# Patient Record
Sex: Female | Born: 1980 | Race: White | Hispanic: No | Marital: Married | Smoking: Never smoker
Health system: Southern US, Community
[De-identification: ages and names within clinical notes are randomized; demographics above are authoritative.]

---

## 1999-05-25 ENCOUNTER — Other Ambulatory Visit: Admission: RE | Admit: 1999-05-25 | Discharge: 1999-05-25 | Payer: Self-pay | Admitting: Internal Medicine

## 2016-05-04 NOTE — Progress Notes (Signed)
Consult Note: Gyn-Onc  Consult was requested by Dr. Marvel Faulkner for the evaluation of Tiffany Faulkner 35 y.o. female  CC:  Chief Complaint  Patient presents with  . adenocarcinoma    Assessment/Faulkner:  [name]  is a 35 y.o.  year old with clinical stage IB1 cervical cancer (adenocarcinoma).  I am recommending PET scan to evaluate for gross extra-cervical disease and to complete the staging.  If stage I disease is confirmed I recommend robotic radical hysterectomy and bilateral salpingectomy with lymph node dissection.   I discussed that she appears eligible for fertility sparing surgery with radical trachelectomy. I discussed what this involves and the potential for increased obstetric risks, necessary cesarean delivery and completion hysterectomy at a later date. The patient has determined that she has completed child bearing and desires definitive surgery with radical hysterectomy.  I discussed risks of the procedure including  bleeding, infection, damage to internal organs (such as bladder,ureters, bowels), blood clot, reoperation and rehospitalization. I discussed the nature of radical parametrial dissection and the increased risk for ureteral injury (including fistula and stricture) and bladder injury which might require additional procedures to fix. We also discussed neurologic injury to bladder and bowel function and the potential for persistent urinary retention and the need to catheterize.   Surgery is scheduled for 05/28/15.   HPI: Tiffany Faulkner is a 35 year old G1P1 who is seen in consultation at the request of Dr Tiffany Faulkner for adenocarcinoma of the cervix.  The patient was considering having a second child and went to see Dr Tiffany Faulkner in November, 2017 for IUD removal. 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   The patient was seen for colposcopy with Dr Tiffany Faulkner on 04/20/16 and a friable lesion  was seen on the face of the cervix. It was biopsied at 4 o'cloc, 2 o'clock, 11 and 7 o'clock as well as ECC and endometrial biopsies all of which confirmed adenocarcinoma.  She reports a prior pap smear in 2015 at the time of her prior delivery which was normal. 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  The patient is otherwise very healthy. She has a history of 1 prior preterm vaginal delivery but no abdominal surgeries.  She reports vaginal spotting but it has been present since 2015 when her IUD was placed.  She reports postcoital bleeding for approximately 3 years (no recent change).   Current Meds:  No outpatient encounter prescriptions on file as of 05/05/2016.   No facility-administered encounter medications on file as of 05/05/2016.     Allergy: Not on File  Social Hx:   Social History   Social History  . Marital status: Married    Spouse name: N/A  . Number of children: N/A  . Years of education: N/A   Occupational History  . Not on file.   Social History Main Topics  . Smoking status: Never Smoker  . Smokeless tobacco: Never Used  . Alcohol use No  . Drug use: No  . Sexual activity: Not on file   Other Topics Concern  . Not on file   Social History Narrative  . No narrative on file    Past Surgical Hx: History reviewed. No pertinent surgical history.  Past Medical Hx: History reviewed. No pertinent past medical history.  Past Gynecological History:  SVD x 1 (preterm at 28 weeks) No  LMP recorded.  Family Hx: History reviewed. No pertinent family history.  Review of Systems:  Constitutional  Feels well,    ENT Normal appearing ears and nares bilaterally Skin/Breast  No rash, sores, jaundice, itching, dryness Cardiovascular  No chest pain, shortness of breath, or edema  Pulmonary  No cough or wheeze.  Gastro Intestinal  No nausea, vomitting, or diarrhoea. No bright red blood per rectum, no  abdominal pain, change in bowel movement, or constipation.  Genito Urinary  No frequency, urgency, dysuria, + bleeding and postcoital spotting Musculo Skeletal  No myalgia, arthralgia, joint swelling or pain  Neurologic  No weakness, numbness, change in gait,  Psychology  No depression, anxiety, insomnia.   Vitals:  Blood pressure 126/75, pulse 80, temperature 97.7 F (36.5 C), temperature source Oral, resp. rate (!) 158, height 5' 3.5" (1.613 m), weight 130 lb 9.6 oz (59.2 kg), SpO2 100 %.  Physical Exam: WD in NAD Neck  Supple NROM, without any enlargements.  Lymph Node Survey No cervical supraclavicular or inguinal adenopathy Cardiovascular  Pulse normal rate, regularity and rhythm. S1 and S2 normal.  Lungs  Clear to auscultation bilateraly, without wheezes/crackles/rhonchi. Good air movement.  Skin  No rash/lesions/breakdown  Psychiatry  Alert and oriented to person, place, and time  Abdomen  Normoactive bowel sounds, abdomen soft, non-tender and thin without evidence of hernia.  Back No CVA tenderness Genito Urinary  Vulva/vagina: Normal external female genitalia.   No lesions. No discharge or bleeding.  Bladder/urethra:  No lesions or masses, well supported bladder  Vagina: normal  Cervix: 3.5cm circumferential, central friable white lesion of endocervix/ectropion  Uterus:  Small, mobile, no parametrial involvement or nodularity.  Adnexa: no discrete masses. Rectal  Good tone, no masses no cul de sac nodularity. No parametrial involvement. Extremities  No bilateral cyanosis, clubbing or edema.   Donaciano Eva, MD  05/05/2016, 11:01 AM

## 2016-05-05 ENCOUNTER — Ambulatory Visit: Payer: BLUE CROSS/BLUE SHIELD | Attending: Gynecologic Oncology | Admitting: Gynecologic Oncology

## 2016-05-05 ENCOUNTER — Encounter: Payer: Self-pay | Admitting: Gynecologic Oncology

## 2016-05-05 ENCOUNTER — Telehealth: Payer: Self-pay

## 2016-05-05 VITALS — BP 126/75 | HR 80 | Temp 97.7°F | Resp 158 | Ht 63.5 in | Wt 130.6 lb

## 2016-05-05 DIAGNOSIS — C801 Malignant (primary) neoplasm, unspecified: Secondary | ICD-10-CM

## 2016-05-05 DIAGNOSIS — C539 Malignant neoplasm of cervix uteri, unspecified: Secondary | ICD-10-CM | POA: Insufficient documentation

## 2016-05-05 DIAGNOSIS — N930 Postcoital and contact bleeding: Secondary | ICD-10-CM | POA: Insufficient documentation

## 2016-05-05 NOTE — Telephone Encounter (Signed)
Notified pt of upcoming appointments: PET Scan 05/24/16 at [facility], and Surgery: 05/27/16 at 1pm, pt will call to schedule preoperative testing appointment, due to needing child care.

## 2016-05-05 NOTE — Patient Instructions (Signed)
RN will call you with PET Scan appointment.     Preparing for your Surgery   Plan for surgery on May 27, 2016 with Dr. Everitt Amber.  Pre-operative Testing -You will receive a phone call from presurgical testing at [facility] to arrange for a pre-operative testing appointment before your surgery.  This appointment normally occurs one to two weeks before your scheduled surgery.   -Bring your insurance card, copy of an advanced directive if applicable, medication list  -At that visit, you will be asked to sign a consent for a possible blood transfusion in case a transfusion becomes necessary during surgery.  The need for a blood transfusion is rare but having consent is a necessary part of your care.     -You should not be taking blood thinners or aspirin at least ten days prior to surgery unless instructed by your surgeon.  Day Before Surgery at [facility] will be asked to take in a light diet the day before surgery.  Avoid carbonated beverages.  You will be advised to have nothing to eat or drink after midnight the evening before.     Eat a light diet the day before surgery.  Examples including soups, broths,  toast, yogurt, mashed potatoes.  Things to avoid include carbonated beverages  (fizzy beverages), raw fruits and raw vegetables, or beans.    If your bowels are filled with gas, your surgeon will have difficulty  visualizing your pelvic organs which increases your surgical risks.  Your role in recovery Your role is to become active as soon as directed by your doctor, while still giving yourself time to heal.  Rest when you feel tired. You will be asked to do the following in order to speed your recovery:  - Cough and breathe deeply. This helps toclear and expand your lungs and can prevent pneumonia. You may be given a spirometer to practice deep breathing. A staff member will show you how to use the spirometer. - Do mild physical activity. Walking or moving  your legs help your circulation and body functions return to normal. A staff member will help you when you try to walk and will provide you with simple exercises. Do not try to get up or walk alone the first time. - Actively manage your pain. Managing your pain lets you move in comfort. We will ask you to rate your pain on a scale of zero to 10. It is your responsibility to tell your doctor or nurse where and how much you hurt so your pain can be treated.  Special Considerations -If you are diabetic, you may be placed on insulin after surgery to have closer control over your blood sugars to promote healing and recovery.  This does not mean that you will be discharged on insulin.  If applicable, your oral antidiabetics will be resumed when you are tolerating a solid diet.  -Your final pathology results from surgery should be available by the Friday after surgery and the results will be relayed to you when available.  Eat a light diet the day before surgery.  Examples including soups, broths, toast, yogurt, mashed potatoes.  Things to avoid include carbonated beverages (fizzy beverages), raw fruits and raw vegetables, or beans.   If your bowels are filled with gas, your surgeon will have difficulty visualizing your pelvic organs which increases your surgical risks. Blood Transfusion Information WHAT IS A BLOOD TRANSFUSION? A transfusion is the replacement of blood or some of its parts. Blood  is made up of multiple cells which provide different functions.  Red blood cells carry oxygen and are used for blood loss replacement.  White blood cells fight against infection.  Platelets control bleeding.  Plasma helps clot blood.  Other blood products are available for specialized needs, such as hemophilia or other clotting disorders. BEFORE THE TRANSFUSION  Who gives blood for transfusions?   You may be able to donate blood to be used at a later date on yourself (autologous donation).  Relatives  can be asked to donate blood. This is generally not any safer than if you have received blood from a stranger. The same precautions are taken to ensure safety when a relative's blood is donated.  Healthy volunteers who are fully evaluated to make sure their blood is safe. This is blood bank blood. Transfusion therapy is the safest it has ever been in the practice of medicine. Before blood is taken from a donor, a complete history is taken to make sure that person has no history of diseases nor engages in risky social behavior (examples are intravenous drug use or sexual activity with multiple partners). The donor's travel history is screened to minimize risk of transmitting infections, such as malaria. The donated blood is tested for signs of infectious diseases, such as HIV and hepatitis. The blood is then tested to be sure it is compatible with you in order to minimize the chance of a transfusion reaction. If you or a relative donates blood, this is often done in anticipation of surgery and is not appropriate for emergency situations. It takes many days to process the donated blood. RISKS AND COMPLICATIONS Although transfusion therapy is very safe and saves many lives, the main dangers of transfusion include:   Getting an infectious disease.  Developing a transfusion reaction. This is an allergic reaction to something in the blood you were given. Every precaution is taken to prevent this. The decision to have a blood transfusion has been considered carefully by your caregiver before blood is given. Blood is not given unless the benefits outweigh the risks.

## 2016-05-06 ENCOUNTER — Telehealth: Payer: Self-pay | Admitting: Gynecologic Oncology

## 2016-05-06 NOTE — Telephone Encounter (Signed)
Peer to peer performed for PET scan.  Approval obtained with Auth # [id number] valid through today-06/04/16.  Notified Gaspar Bidding with prior auths via EPIC

## 2016-05-21 NOTE — Patient Instructions (Signed)
Tiffany Faulkner  05/21/2016   Your procedure is scheduled on: 05/27/16    Report to [facility] Main  Entrance take [place]  elevators to 3rd floor to  [place] at [place] AM.  Call this number if you have problems the morning of surgery [phone]   Remember: ONLY 1 PERSON MAY GO WITH YOU TO SHORT STAY TO GET  READY MORNING OF [place].  Do not eat food or drink liquids :After Midnight.             Eat a light diet the day before surgery.  Examples include toast, soups, broths, yogurt and mashed potatoes.  Things to avoid include carbonated beverages, raw fruits and vegetables and beans.       Take these medicines the morning of surgery with A SIP OF WATER: none                                 You may not have any metal on your body including hair pins and              piercings  Do not wear jewelry, make-up, lotions, powders or perfumes, deodorant             Do not wear nail polish.  Do not shave  48 hours prior to surgery.             Do not bring valuables to the hospital. [place].  Contacts, dentures or bridgework may not be worn into surgery.  Leave suitcase in the car. After surgery it may be brought to your room.         Special Instructions: coughing and deep breathing exercises, leg exercises               Please read over the following fact sheets you were given: _____________________________________________________________________             [facility] - Preparing for Surgery Before surgery, you can play an important role.  Because skin is not sterile, your skin needs to be as free of germs as possible.  You can reduce the number of germs on your skin by washing with CHG (chlorahexidine gluconate) soap before surgery.  CHG is an antiseptic cleaner which kills germs and bonds with the skin to continue killing germs even after washing. Please DO NOT use if you have an allergy to CHG or  antibacterial soaps.  If your skin becomes reddened/irritated stop using the CHG and inform your nurse when you arrive at Short Stay. Do not shave (including legs and underarms) for at least 48 hours prior to the first CHG shower.  You may shave your face/neck. Please follow these instructions carefully:  1.  Shower with CHG Soap the night before surgery and the  morning of Surgery.  2.  If you choose to wash your hair, wash your hair first as usual with your  normal  shampoo.  3.  After you shampoo, rinse your hair and body thoroughly to remove the  shampoo.                           4.  Use CHG as you would  any other liquid soap.  You can apply chg directly  to the skin and wash                       Gently with a scrungie or clean washcloth.  5.  Apply the CHG Soap to your body ONLY FROM THE NECK DOWN.   Do not use on face/ open                           Wound or open sores. Avoid contact with eyes, ears mouth and genitals (private parts).                       Wash face,  Genitals (private parts) with your normal soap.             6.  Wash thoroughly, paying special attention to the area where your surgery  will be performed.  7.  Thoroughly rinse your body with warm water from the neck down.  8.  DO NOT shower/wash with your normal soap after using and rinsing off  the CHG Soap.                9.  Pat yourself dry with a clean towel.            10.  Wear clean pajamas.            11.  Place clean sheets on your bed the night of your first shower and do not  sleep with pets. Day of Surgery : Do not apply any lotions/deodorants the morning of surgery.  Please wear clean clothes to the hospital/surgery center.  FAILURE TO FOLLOW THESE INSTRUCTIONS MAY RESULT IN THE CANCELLATION OF YOUR SURGERY PATIENT SIGNATURE_________________________________  NURSE SIGNATURE__________________________________  ________________________________________________________________________  WHAT IS A BLOOD  TRANSFUSION? Blood Transfusion Information  A transfusion is the replacement of blood or some of its parts. Blood is made up of multiple cells which provide different functions.  Red blood cells carry oxygen and are used for blood loss replacement.  White blood cells fight against infection.  Platelets control bleeding.  Plasma helps clot blood.  Other blood products are available for specialized needs, such as hemophilia or other clotting disorders. BEFORE THE TRANSFUSION  Who gives blood for transfusions?   Healthy volunteers who are fully evaluated to make sure their blood is safe. This is blood bank blood. Transfusion therapy is the safest it has ever been in the practice of medicine. Before blood is taken from a donor, a complete history is taken to make sure that person has no history of diseases nor engages in risky social behavior (examples are intravenous drug use or sexual activity with multiple partners). The donor's travel history is screened to minimize risk of transmitting infections, such as malaria. The donated blood is tested for signs of infectious diseases, such as HIV and hepatitis. The blood is then tested to be sure it is compatible with you in order to minimize the chance of a transfusion reaction. If you or a relative donates blood, this is often done in anticipation of surgery and is not appropriate for emergency situations. It takes many days to process the donated blood. RISKS AND COMPLICATIONS Although transfusion therapy is very safe and saves many lives, the main dangers of transfusion include:   Getting an infectious disease.  Developing a transfusion reaction. This is an allergic reaction to something in  the blood you were given. Every precaution is taken to prevent this. The decision to have a blood transfusion has been considered carefully by your caregiver before blood is given. Blood is not given unless the benefits outweigh the risks. AFTER THE  TRANSFUSION  Right after receiving a blood transfusion, you will usually feel much better and more energetic. This is especially true if your red blood cells have gotten low (anemic). The transfusion raises the level of the red blood cells which carry oxygen, and this usually causes an energy increase.  The nurse administering the transfusion will monitor you carefully for complications. HOME CARE INSTRUCTIONS  No special instructions are needed after a transfusion. You may find your energy is better. Speak with your caregiver about any limitations on activity for underlying diseases you may have. SEEK MEDICAL CARE IF:   Your condition is not improving after your transfusion.  You develop redness or irritation at the intravenous (IV) site. SEEK IMMEDIATE MEDICAL CARE IF:  Any of the following symptoms occur over the next 12 hours:  Shaking chills.  You have a temperature by mouth above 102 F (38.9 C), not controlled by medicine.  Chest, back, or muscle pain.  People around you feel you are not acting correctly or are confused.  Shortness of breath or difficulty breathing.  Dizziness and fainting.  You get a rash or develop hives.  You have a decrease in urine output.  Your urine turns a dark color or changes to pink, red, or brown. Any of the following symptoms occur over the next 10 days:  You have a temperature by mouth above 102 F (38.9 C), not controlled by medicine.  Shortness of breath.  Weakness after normal activity.  The white part of the eye turns yellow (jaundice).  You have a decrease in the amount of urine or are urinating less often.  Your urine turns a dark color or changes to pink, red, or brown. Document Released: 04/23/2000 Document Revised: 07/19/2011 Document Reviewed: 12/11/2007 ExitCare Patient Information 2014 [place].  _______________________________________________________________________  Incentive Spirometer  An incentive  spirometer is a tool that can help keep your lungs clear and active. This tool measures how well you are filling your lungs with each breath. Taking long deep breaths may help reverse or decrease the chance of developing breathing (pulmonary) problems (especially infection) following:  A long period of time when you are unable to move or be active. BEFORE THE PROCEDURE   If the spirometer includes an indicator to show your best effort, your nurse or respiratory therapist will set it to a desired goal.  If possible, sit up straight or lean slightly forward. Try not to slouch.  Hold the incentive spirometer in an upright position. INSTRUCTIONS FOR USE  1. Sit on the edge of your bed if possible, or sit up as far as you can in bed or on a chair. 2. Hold the incentive spirometer in an upright position. 3. Breathe out normally. 4. Place the mouthpiece in your mouth and seal your lips tightly around it. 5. Breathe in slowly and as deeply as possible, raising the piston or the ball toward the top of the column. 6. Hold your breath for 3-5 seconds or for as long as possible. Allow the piston or ball to fall to the bottom of the column. 7. Remove the mouthpiece from your mouth and breathe out normally. 8. Rest for a few seconds and repeat Steps 1 through 7 at least 10 times every  1-2 hours when you are awake. Take your time and take a few normal breaths between deep breaths. 9. The spirometer may include an indicator to show your best effort. Use the indicator as a goal to work toward during each repetition. 10. After each set of 10 deep breaths, practice coughing to be sure your lungs are clear. If you have an incision (the cut made at the time of surgery), support your incision when coughing by placing a pillow or rolled up towels firmly against it. Once you are able to get out of bed, walk around indoors and cough well. You may stop using the incentive spirometer when instructed by your caregiver.   RISKS AND COMPLICATIONS  Take your time so you do not get dizzy or light-headed.  If you are in pain, you may need to take or ask for pain medication before doing incentive spirometry. It is harder to take a deep breath if you are having pain. AFTER USE  Rest and breathe slowly and easily.  It can be helpful to keep track of a log of your progress. Your caregiver can provide you with a simple table to help with this. If you are using the spirometer at home, follow these instructions: [place] IF:   You are having difficultly using the spirometer.  You have trouble using the spirometer as often as instructed.  Your pain medication is not giving enough relief while using the spirometer.  You develop fever of 100.5 F (38.1 C) or higher. SEEK IMMEDIATE MEDICAL CARE IF:   You cough up bloody sputum that had not been present before.  You develop fever of 102 F (38.9 C) or greater.  You develop worsening pain at or near the incision site. MAKE SURE YOU:   Understand these instructions.  Will watch your condition.  Will get help right away if you are not doing well or get worse. Document Released: 09/06/2006 Document Revised: 07/19/2011 Document Reviewed: 11/07/2006 [facility] Patient Information 2014 [place], [place].   ________________________________________________________________________

## 2016-05-24 ENCOUNTER — Encounter (HOSPITAL_COMMUNITY): Payer: Self-pay | Admitting: Radiology

## 2016-05-24 ENCOUNTER — Telehealth: Payer: Self-pay

## 2016-05-24 ENCOUNTER — Encounter (HOSPITAL_COMMUNITY)
Admission: RE | Admit: 2016-05-24 | Discharge: 2016-05-24 | Disposition: A | Discharge status: Home / Self Care | Payer: BLUE CROSS/BLUE SHIELD | Source: Ambulatory Visit | Attending: Gynecologic Oncology | Admitting: Gynecologic Oncology

## 2016-05-24 ENCOUNTER — Encounter (INDEPENDENT_AMBULATORY_CARE_PROVIDER_SITE_OTHER): Payer: Self-pay

## 2016-05-24 DIAGNOSIS — Z01818 Encounter for other preprocedural examination: Secondary | ICD-10-CM | POA: Insufficient documentation

## 2016-05-24 DIAGNOSIS — C760 Malignant neoplasm of head, face and neck: Secondary | ICD-10-CM

## 2016-05-24 DIAGNOSIS — C539 Malignant neoplasm of cervix uteri, unspecified: Secondary | ICD-10-CM

## 2016-05-24 DIAGNOSIS — N930 Postcoital and contact bleeding: Secondary | ICD-10-CM | POA: Diagnosis not present

## 2016-05-24 DIAGNOSIS — N838 Other noninflammatory disorders of ovary, fallopian tube and broad ligament: Secondary | ICD-10-CM | POA: Diagnosis not present

## 2016-05-24 LAB — PREGNANCY, URINE: Preg Test, Ur: NEGATIVE

## 2016-05-24 LAB — URINALYSIS, ROUTINE W REFLEX MICROSCOPIC
BILIRUBIN URINE: NEGATIVE
GLUCOSE, UA: NEGATIVE mg/dL
Hgb urine dipstick: NEGATIVE
Ketones, ur: NEGATIVE mg/dL
Leukocytes, UA: NEGATIVE
NITRITE: NEGATIVE
Protein, ur: NEGATIVE mg/dL
SPECIFIC GRAVITY, URINE: 1.01 (ref 1.005–1.030)
pH: 8 (ref 5.0–8.0)

## 2016-05-24 LAB — CBC WITH DIFFERENTIAL/PLATELET
Basophils Absolute: 0 10*3/uL (ref 0.0–0.1)
Basophils Relative: 0 %
Eosinophils Absolute: 0.1 10*3/uL (ref 0.0–0.7)
Eosinophils Relative: 1 %
HCT: 38.2 % (ref 36.0–46.0)
HEMOGLOBIN: 13.2 g/dL (ref 12.0–15.0)
LYMPHS ABS: 1.8 10*3/uL (ref 0.7–4.0)
LYMPHS PCT: 36 %
MCH: 31.7 pg (ref 26.0–34.0)
MCHC: 34.6 g/dL (ref 30.0–36.0)
MCV: 91.8 fL (ref 78.0–100.0)
MONOS PCT: 6 %
Monocytes Absolute: 0.3 10*3/uL (ref 0.1–1.0)
NEUTROS PCT: 57 %
Neutro Abs: 2.8 10*3/uL (ref 1.7–7.7)
Platelets: 212 10*3/uL (ref 150–400)
RBC: 4.16 MIL/uL (ref 3.87–5.11)
RDW: 12.7 % (ref 11.5–15.5)
WBC: 4.9 10*3/uL (ref 4.0–10.5)

## 2016-05-24 LAB — COMPREHENSIVE METABOLIC PANEL
ALK PHOS: 37 U/L — AB (ref 38–126)
ALT: 7 U/L — AB (ref 14–54)
ANION GAP: 6 (ref 5–15)
AST: 12 U/L — ABNORMAL LOW (ref 15–41)
Albumin: 4.4 g/dL (ref 3.5–5.0)
BUN: 12 mg/dL (ref 6–20)
CALCIUM: 8.8 mg/dL — AB (ref 8.9–10.3)
CO2: 25 mmol/L (ref 22–32)
CREATININE: 0.57 mg/dL (ref 0.44–1.00)
Chloride: 107 mmol/L (ref 101–111)
Glucose, Bld: 87 mg/dL (ref 65–99)
Potassium: 4.1 mmol/L (ref 3.5–5.1)
Sodium: 138 mmol/L (ref 135–145)
TOTAL PROTEIN: 6.9 g/dL (ref 6.5–8.1)
Total Bilirubin: 0.3 mg/dL (ref 0.3–1.2)

## 2016-05-24 LAB — GLUCOSE, CAPILLARY: Glucose-Capillary: 84 mg/dL (ref 65–99)

## 2016-05-24 LAB — ABO/RH: ABO/RH(D): A POS

## 2016-05-24 MED ORDER — FLUDEOXYGLUCOSE F - 18 (FDG) INJECTION
7.6000 | Freq: Once | INTRAVENOUS | Status: AC | PRN
  Administered 2016-05-24: 7.6 via INTRAVENOUS

## 2016-05-24 NOTE — Telephone Encounter (Signed)
Pt informed of normal test result. Verbalized understanding. 

## 2016-05-27 ENCOUNTER — Encounter (HOSPITAL_COMMUNITY): Payer: Self-pay | Admitting: *Deleted

## 2016-05-27 ENCOUNTER — Ambulatory Visit (HOSPITAL_COMMUNITY)
Admission: RE | Admit: 2016-05-27 | Discharge: 2016-05-28 | Disposition: A | Discharge status: Home / Self Care | Payer: BLUE CROSS/BLUE SHIELD | Source: Ambulatory Visit | Attending: Gynecologic Oncology | Admitting: Gynecologic Oncology

## 2016-05-27 ENCOUNTER — Encounter (HOSPITAL_COMMUNITY)
Admission: RE | Disposition: A | Discharge status: Home / Self Care | Payer: Self-pay | Source: Ambulatory Visit | Attending: Gynecologic Oncology

## 2016-05-27 ENCOUNTER — Ambulatory Visit (HOSPITAL_COMMUNITY): Payer: BLUE CROSS/BLUE SHIELD | Admitting: Registered Nurse

## 2016-05-27 DIAGNOSIS — N838 Other noninflammatory disorders of ovary, fallopian tube and broad ligament: Secondary | ICD-10-CM | POA: Insufficient documentation

## 2016-05-27 DIAGNOSIS — C539 Malignant neoplasm of cervix uteri, unspecified: Secondary | ICD-10-CM | POA: Diagnosis not present

## 2016-05-27 DIAGNOSIS — N930 Postcoital and contact bleeding: Secondary | ICD-10-CM | POA: Insufficient documentation

## 2016-05-27 HISTORY — PX: BILATERAL SALPINGECTOMY: SHX5743

## 2016-05-27 HISTORY — PX: LYMPH NODE DISSECTION: SHX5087

## 2016-05-27 HISTORY — PX: ROBOTIC ASSISTED TOTAL HYSTERECTOMY: SHX6085

## 2016-05-27 LAB — TYPE AND SCREEN
ABO/RH(D): A POS
ANTIBODY SCREEN: NEGATIVE

## 2016-05-27 SURGERY — HYSTERECTOMY, TOTAL, ROBOT-ASSISTED
Anesthesia: General

## 2016-05-27 MED ORDER — MIDAZOLAM HCL 5 MG/5ML IJ SOLN
INTRAMUSCULAR | Status: DC | PRN
  Administered 2016-05-27 (×2): 1 mg via INTRAVENOUS

## 2016-05-27 MED ORDER — ROCURONIUM BROMIDE 50 MG/5ML IV SOSY
PREFILLED_SYRINGE | INTRAVENOUS | Status: AC
  Filled 2016-05-27: qty 5

## 2016-05-27 MED ORDER — OXYCODONE-ACETAMINOPHEN 5-325 MG PO TABS
1.0000 | ORAL_TABLET | ORAL | Status: DC | PRN
  Administered 2016-05-27: 1 via ORAL
  Administered 2016-05-28: 2 via ORAL
  Filled 2016-05-27 (×2): qty 2

## 2016-05-27 MED ORDER — ONDANSETRON HCL 4 MG/2ML IJ SOLN
INTRAMUSCULAR | Status: AC
  Filled 2016-05-27: qty 2

## 2016-05-27 MED ORDER — KETAMINE HCL 10 MG/ML IJ SOLN
INTRAMUSCULAR | Status: AC
  Filled 2016-05-27: qty 1

## 2016-05-27 MED ORDER — ACETAMINOPHEN 500 MG PO TABS
1000.0000 mg | ORAL_TABLET | Freq: Once | ORAL | Status: AC
  Administered 2016-05-27: 1000 mg via ORAL

## 2016-05-27 MED ORDER — DEXAMETHASONE SODIUM PHOSPHATE 10 MG/ML IJ SOLN
INTRAMUSCULAR | Status: AC
  Filled 2016-05-27: qty 1

## 2016-05-27 MED ORDER — FENTANYL CITRATE (PF) 100 MCG/2ML IJ SOLN
INTRAMUSCULAR | Status: DC | PRN
  Administered 2016-05-27 (×3): 50 ug via INTRAVENOUS

## 2016-05-27 MED ORDER — GABAPENTIN 300 MG PO CAPS
300.0000 mg | ORAL_CAPSULE | Freq: Once | ORAL | Status: AC
  Administered 2016-05-27: 300 mg via ORAL

## 2016-05-27 MED ORDER — PROMETHAZINE HCL 25 MG/ML IJ SOLN: 6.2500 mg | INTRAMUSCULAR | Status: DC | PRN

## 2016-05-27 MED ORDER — KETAMINE HCL 10 MG/ML IJ SOLN
INTRAMUSCULAR | Status: DC | PRN
  Administered 2016-05-27 (×2): 15 mg via INTRAVENOUS

## 2016-05-27 MED ORDER — LIDOCAINE HCL (CARDIAC) 20 MG/ML IV SOLN
INTRAVENOUS | Status: DC | PRN
  Administered 2016-05-27: 100 mg via INTRAVENOUS

## 2016-05-27 MED ORDER — MIDAZOLAM HCL 2 MG/2ML IJ SOLN
INTRAMUSCULAR | Status: AC
  Filled 2016-05-27: qty 2

## 2016-05-27 MED ORDER — DEXAMETHASONE SODIUM PHOSPHATE 10 MG/ML IJ SOLN
INTRAMUSCULAR | Status: DC | PRN
  Administered 2016-05-27: 10 mg via INTRAVENOUS

## 2016-05-27 MED ORDER — PROPOFOL 10 MG/ML IV BOLUS
INTRAVENOUS | Status: AC
  Filled 2016-05-27: qty 20

## 2016-05-27 MED ORDER — ACETAMINOPHEN 500 MG PO TABS
ORAL_TABLET | ORAL | Status: AC
  Filled 2016-05-27: qty 2

## 2016-05-27 MED ORDER — FENTANYL CITRATE (PF) 100 MCG/2ML IJ SOLN
INTRAMUSCULAR | Status: AC
  Filled 2016-05-27: qty 2

## 2016-05-27 MED ORDER — LIDOCAINE 2% (20 MG/ML) 5 ML SYRINGE
INTRAMUSCULAR | Status: AC
  Filled 2016-05-27: qty 5

## 2016-05-27 MED ORDER — HYDROMORPHONE HCL 2 MG/ML IJ SOLN: 0.2000 mg | INTRAMUSCULAR | Status: DC | PRN

## 2016-05-27 MED ORDER — GABAPENTIN 300 MG PO CAPS
ORAL_CAPSULE | ORAL | Status: AC
  Filled 2016-05-27: qty 1

## 2016-05-27 MED ORDER — SUGAMMADEX SODIUM 200 MG/2ML IV SOLN
INTRAVENOUS | Status: DC | PRN
  Administered 2016-05-27: 120 mg via INTRAVENOUS

## 2016-05-27 MED ORDER — ENOXAPARIN SODIUM 40 MG/0.4ML ~~LOC~~ SOLN
40.0000 mg | SUBCUTANEOUS | Status: AC
  Administered 2016-05-27: 40 mg via SUBCUTANEOUS
  Filled 2016-05-27: qty 0.4

## 2016-05-27 MED ORDER — GABAPENTIN 300 MG PO CAPS
600.0000 mg | ORAL_CAPSULE | Freq: Every day | ORAL | Status: AC
  Administered 2016-05-27: 600 mg via ORAL
  Filled 2016-05-27: qty 2

## 2016-05-27 MED ORDER — KETOROLAC TROMETHAMINE 15 MG/ML IJ SOLN
15.0000 mg | Freq: Four times a day (QID) | INTRAMUSCULAR | Status: DC
  Administered 2016-05-27 – 2016-05-28 (×3): 15 mg via INTRAVENOUS
  Filled 2016-05-27 (×3): qty 1

## 2016-05-27 MED ORDER — ONDANSETRON HCL 4 MG/2ML IJ SOLN: 4.0000 mg | Freq: Four times a day (QID) | INTRAMUSCULAR | Status: DC | PRN

## 2016-05-27 MED ORDER — ENOXAPARIN SODIUM 40 MG/0.4ML ~~LOC~~ SOLN
40.0000 mg | SUBCUTANEOUS | Status: DC
  Administered 2016-05-28: 40 mg via SUBCUTANEOUS
  Filled 2016-05-27: qty 0.4

## 2016-05-27 MED ORDER — LACTATED RINGERS IV SOLN
INTRAVENOUS | Status: DC | PRN
  Administered 2016-05-27: 09:00:00 via INTRAVENOUS

## 2016-05-27 MED ORDER — EPHEDRINE 5 MG/ML INJ
INTRAVENOUS | Status: AC
  Filled 2016-05-27: qty 10

## 2016-05-27 MED ORDER — KCL IN DEXTROSE-NACL 20-5-0.45 MEQ/L-%-% IV SOLN
INTRAVENOUS | Status: DC
  Administered 2016-05-27: 18:00:00 via INTRAVENOUS
  Filled 2016-05-27 (×3): qty 1000

## 2016-05-27 MED ORDER — ROCURONIUM BROMIDE 10 MG/ML (PF) SYRINGE
PREFILLED_SYRINGE | INTRAVENOUS | Status: DC | PRN
  Administered 2016-05-27: 10 mg via INTRAVENOUS
  Administered 2016-05-27: 20 mg via INTRAVENOUS
  Administered 2016-05-27: 5 mg via INTRAVENOUS
  Administered 2016-05-27: 10 mg via INTRAVENOUS
  Administered 2016-05-27: 50 mg via INTRAVENOUS

## 2016-05-27 MED ORDER — FENTANYL CITRATE (PF) 250 MCG/5ML IJ SOLN
INTRAMUSCULAR | Status: AC
  Filled 2016-05-27: qty 5

## 2016-05-27 MED ORDER — LACTATED RINGERS IV SOLN
INTRAVENOUS | Status: DC
  Administered 2016-05-27 (×2): 1000 mL via INTRAVENOUS

## 2016-05-27 MED ORDER — GLYCOPYRROLATE 0.2 MG/ML IV SOSY
PREFILLED_SYRINGE | INTRAVENOUS | Status: AC
  Filled 2016-05-27: qty 3

## 2016-05-27 MED ORDER — EPHEDRINE SULFATE-NACL 50-0.9 MG/10ML-% IV SOSY
PREFILLED_SYRINGE | INTRAVENOUS | Status: DC | PRN
  Administered 2016-05-27: 5 mg via INTRAVENOUS

## 2016-05-27 MED ORDER — LACTATED RINGERS IR SOLN
Status: DC | PRN
  Administered 2016-05-27: 1000 mL

## 2016-05-27 MED ORDER — SUGAMMADEX SODIUM 200 MG/2ML IV SOLN
INTRAVENOUS | Status: AC
  Filled 2016-05-27: qty 2

## 2016-05-27 MED ORDER — PROPOFOL 10 MG/ML IV BOLUS
INTRAVENOUS | Status: DC | PRN
  Administered 2016-05-27: 120 mg via INTRAVENOUS

## 2016-05-27 MED ORDER — CEFAZOLIN SODIUM-DEXTROSE 2-4 GM/100ML-% IV SOLN
2.0000 g | INTRAVENOUS | Status: AC
  Administered 2016-05-27: 2 g via INTRAVENOUS
  Filled 2016-05-27: qty 100

## 2016-05-27 MED ORDER — FENTANYL CITRATE (PF) 100 MCG/2ML IJ SOLN
25.0000 ug | INTRAMUSCULAR | Status: DC | PRN
  Administered 2016-05-27 (×3): 50 ug via INTRAVENOUS

## 2016-05-27 MED ORDER — ONDANSETRON HCL 4 MG/2ML IJ SOLN
INTRAMUSCULAR | Status: DC | PRN
  Administered 2016-05-27: 4 mg via INTRAVENOUS

## 2016-05-27 MED ORDER — LACTATED RINGERS IV SOLN: INTRAVENOUS | Status: DC

## 2016-05-27 MED ORDER — CEFAZOLIN SODIUM-DEXTROSE 2-4 GM/100ML-% IV SOLN
INTRAVENOUS | Status: AC
  Filled 2016-05-27: qty 100

## 2016-05-27 MED ORDER — IBUPROFEN 800 MG PO TABS
800.0000 mg | ORAL_TABLET | Freq: Three times a day (TID) | ORAL | Status: DC | PRN
Start: 2016-05-28 — End: 2016-05-28

## 2016-05-27 MED ORDER — ONDANSETRON HCL 4 MG PO TABS: 4.0000 mg | ORAL_TABLET | Freq: Four times a day (QID) | ORAL | Status: DC | PRN

## 2016-05-27 MED ORDER — STERILE WATER FOR IRRIGATION IR SOLN
Status: DC | PRN
  Administered 2016-05-27: 1000 mL

## 2016-05-27 SURGICAL SUPPLY — 63 items
ADH SKN CLS APL DERMABOND .7 (GAUZE/BANDAGES/DRESSINGS) ×2
AGENT HMST KT MTR STRL THRMB (HEMOSTASIS)
APL ESCP 34 STRL LF DISP (HEMOSTASIS)
APPLICATOR SURGIFLO ENDO (HEMOSTASIS) IMPLANT
APPLIER CLIP ROT 10 11.4 M/L (STAPLE) ×4
APR CLP MED LRG 11.4X10 (STAPLE) ×2
BAG LAPAROSCOPIC 12 15 PORT 16 (BASKET) IMPLANT
BAG RETRIEVAL 12/15 (BASKET)
BAG RETRIEVAL 12/15MM (BASKET)
BAG SPEC RTRVL LRG 6X4 10 (ENDOMECHANICALS) ×2
CHLORAPREP W/TINT 26ML (MISCELLANEOUS) ×4 IMPLANT
CLIP APPLIE ROT 10 11.4 M/L (STAPLE) IMPLANT
COVER SURGICAL LIGHT HANDLE (MISCELLANEOUS) ×4 IMPLANT
COVER TIP SHEARS 8 DVNC (MISCELLANEOUS) ×2 IMPLANT
COVER TIP SHEARS 8MM DA VINCI (MISCELLANEOUS) ×2
DERMABOND ADVANCED (GAUZE/BANDAGES/DRESSINGS) ×2
DERMABOND ADVANCED .7 DNX12 (GAUZE/BANDAGES/DRESSINGS) ×2 IMPLANT
DRAPE ARM DVNC X/XI (DISPOSABLE) ×8 IMPLANT
DRAPE COLUMN DVNC XI (DISPOSABLE) ×2 IMPLANT
DRAPE DA VINCI XI ARM (DISPOSABLE) ×8
DRAPE DA VINCI XI COLUMN (DISPOSABLE) ×2
DRAPE SHEET LG 3/4 BI-LAMINATE (DRAPES) ×8 IMPLANT
DRAPE SURG IRRIG POUCH 19X23 (DRAPES) ×4 IMPLANT
ELECT REM PT RETURN 9FT ADLT (ELECTROSURGICAL) ×4
ELECTRODE REM PT RTRN 9FT ADLT (ELECTROSURGICAL) ×2 IMPLANT
GLOVE BIO SURGEON STRL SZ 6 (GLOVE) ×16 IMPLANT
GLOVE BIO SURGEON STRL SZ 6.5 (GLOVE) ×6 IMPLANT
GLOVE BIO SURGEONS STRL SZ 6.5 (GLOVE) ×2
GOWN STRL REUS W/ TWL LRG LVL3 (GOWN DISPOSABLE) ×4 IMPLANT
GOWN STRL REUS W/TWL LRG LVL3 (GOWN DISPOSABLE) ×8
HOLDER FOLEY CATH W/STRAP (MISCELLANEOUS) ×4 IMPLANT
IRRIG SUCT STRYKERFLOW 2 WTIP (MISCELLANEOUS) ×4
IRRIGATION SUCT STRKRFLW 2 WTP (MISCELLANEOUS) ×2 IMPLANT
KIT BASIN OR (CUSTOM PROCEDURE TRAY) ×4 IMPLANT
MANIPULATOR UTERINE 4.5 ZUMI (MISCELLANEOUS) ×2 IMPLANT
MARKER SKIN DUAL TIP RULER LAB (MISCELLANEOUS) ×4 IMPLANT
OBTURATOR OPTICAL STANDARD 8MM (TROCAR) ×2
OBTURATOR OPTICAL STND 8 DVNC (TROCAR) ×2
OBTURATOR OPTICALSTD 8 DVNC (TROCAR) ×2 IMPLANT
OCCLUDER COLPOPNEUMO (BALLOONS) ×4 IMPLANT
PAD POSITIONING PINK XL (MISCELLANEOUS) ×4 IMPLANT
PORT ACCESS TROCAR AIRSEAL 12 (TROCAR) ×2 IMPLANT
PORT ACCESS TROCAR AIRSEAL 5M (TROCAR) ×2
POUCH SPECIMEN RETRIEVAL 10MM (ENDOMECHANICALS) ×2 IMPLANT
SEAL CANN UNIV 5-8 DVNC XI (MISCELLANEOUS) ×8 IMPLANT
SEAL XI 5MM-8MM UNIVERSAL (MISCELLANEOUS) ×8
SET TRI-LUMEN FLTR TB AIRSEAL (TUBING) ×4 IMPLANT
SHEET LAVH (DRAPES) ×4 IMPLANT
SOLUTION ELECTROLUBE (MISCELLANEOUS) ×4 IMPLANT
SURGIFLO W/THROMBIN 8M KIT (HEMOSTASIS) IMPLANT
SUT MNCRL AB 4-0 PS2 18 (SUTURE) ×8 IMPLANT
SUT PROLENE 5 0 CC 1 (SUTURE) IMPLANT
SUT VIC AB 0 CT1 27 (SUTURE) ×12
SUT VIC AB 0 CT1 27XBRD ANTBC (SUTURE) ×2 IMPLANT
SYR 50ML LL SCALE MARK (SYRINGE) ×4 IMPLANT
TOWEL OR 17X26 10 PK STRL BLUE (TOWEL DISPOSABLE) ×4 IMPLANT
TOWEL OR NON WOVEN STRL DISP B (DISPOSABLE) ×4 IMPLANT
TRAP SPECIMEN MUCOUS 40CC (MISCELLANEOUS) IMPLANT
TRAY FOLEY W/METER SILVER 16FR (SET/KITS/TRAYS/PACK) ×4 IMPLANT
TRAY LAPAROSCOPIC (CUSTOM PROCEDURE TRAY) ×4 IMPLANT
TROCAR BLADELESS OPT 5 100 (ENDOMECHANICALS) ×4 IMPLANT
UNDERPAD 30X30 INCONTINENT (UNDERPADS AND DIAPERS) ×4 IMPLANT
WATER STERILE IRR 1500ML POUR (IV SOLUTION) ×2 IMPLANT

## 2016-05-27 NOTE — Anesthesia Preprocedure Evaluation (Addendum)
Anesthesia Evaluation  Patient identified by MRN, date of birth, ID band Patient awake    Reviewed: Allergy & Precautions, NPO status , Patient's Chart, lab work & pertinent test results  Airway Mallampati: II  TM Distance: >3 FB Neck ROM: Full    Dental  (+) Teeth Intact, Dental Advisory Given   Pulmonary neg pulmonary ROS,    Pulmonary exam normal breath sounds clear to auscultation       Cardiovascular Exercise Tolerance: Good negative cardio ROS Normal cardiovascular exam Rhythm:Regular Rate:Normal     Neuro/Psych negative neurological ROS  negative psych ROS   GI/Hepatic negative GI ROS, Neg liver ROS,   Endo/Other  negative endocrine ROS  Renal/GU negative Renal ROS     Musculoskeletal negative musculoskeletal ROS (+)   Abdominal   Peds  Hematology negative hematology ROS (+)   Anesthesia Other Findings Day of surgery medications reviewed with the patient.  Reproductive/Obstetrics clinical stage IB1 cervical cancer                              Anesthesia Physical Anesthesia Plan  ASA: II  Anesthesia Plan: General   Post-op Pain Management:    Induction: Intravenous  Airway Management Planned: Oral ETT  Additional Equipment:   Intra-op Plan:   Post-operative Plan: Extubation in OR  Informed Consent: I have reviewed the patients History and Physical, chart, labs and discussed the procedure including the risks, benefits and alternatives for the proposed anesthesia with the patient or authorized representative who has indicated his/her understanding and acceptance.   Dental advisory given  Plan Discussed with: CRNA  Anesthesia Plan Comments: (Risks/benefits of general anesthesia discussed with patient including risk of damage to teeth, lips, gum, and tongue, nausea/vomiting, allergic reactions to medications, and the possibility of heart attack, stroke and death.  All  patient questions answered.  Patient wishes to proceed.)        Anesthesia Quick Evaluation

## 2016-05-27 NOTE — Anesthesia Procedure Notes (Signed)
Procedure Name: Intubation Date/Time: 05/27/2016 9:06 AM Performed by: Carleene Cooper A Pre-anesthesia Checklist: Patient identified, Timeout performed, Emergency Drugs available, Suction available and Patient being monitored Patient Re-evaluated:Patient Re-evaluated prior to inductionOxygen Delivery Method: Circle system utilized Preoxygenation: Pre-oxygenation with 100% oxygen Intubation Type: IV induction Ventilation: Mask ventilation without difficulty Laryngoscope Size: Mac and 3 Grade View: Grade I Tube type: Oral Tube size: 7.5 mm Number of attempts: 1 Airway Equipment and Method: Stylet Placement Confirmation: ETT inserted through vocal cords under direct vision,  positive ETCO2 and breath sounds checked- equal and bilateral Secured at: 21 cm Tube secured with: Tape Dental Injury: Teeth and Oropharynx as per pre-operative assessment

## 2016-05-27 NOTE — Anesthesia Postprocedure Evaluation (Signed)
Anesthesia Post Note  Patient: Tiffany Faulkner  Procedure(s) Performed: Procedure(s) (LRB): XI ROBOTIC ASSISTED RADICAL  HYSTERECTOMY,BILATERAL OVARIAN TRANSPOSITION (N/A) BILATERAL SALPINGECTOMY (Bilateral) LYMPH NODE DISSECTION (Bilateral)  Patient location during evaluation: PACU Anesthesia Type: General Level of consciousness: awake and alert Pain management: pain level controlled Vital Signs Assessment: post-procedure vital signs reviewed and stable Respiratory status: spontaneous breathing, nonlabored ventilation, respiratory function stable and patient connected to nasal cannula oxygen Cardiovascular status: blood pressure returned to baseline and stable Postop Assessment: no signs of nausea or vomiting Anesthetic complications: no       Last Vitals:  Vitals:   05/27/16 0704 05/27/16 1209  BP: 109/66 (!) 93/57  Pulse: 82 89  Resp: 16 14  Temp: 36.7 C 36.5 C    Last Pain:  Vitals:   05/27/16 1330  TempSrc:   PainSc: [place]

## 2016-05-27 NOTE — Transfer of Care (Signed)
Immediate Anesthesia Transfer of Care Note  Patient: Tanisia Deahl  Procedure(s) Performed: Procedure(s): XI ROBOTIC ASSISTED RADICAL  HYSTERECTOMY,BILATERAL OVARIAN TRANSPOSITION (N/A) BILATERAL SALPINGECTOMY (Bilateral) LYMPH NODE DISSECTION (Bilateral)  Patient Location: PACU  Anesthesia Type:General  Level of Consciousness: awake, alert , oriented and patient cooperative  Airway & Oxygen Therapy: Patient Spontanous Breathing and Patient connected to face mask oxygen  Post-op Assessment: Report given to RN and Post -op Vital signs reviewed and stable  Post vital signs: Reviewed and stable  Last Vitals:  Vitals:   05/27/16 0704  BP: 109/66  Pulse: 82  Resp: 16  Temp: 36.7 C    Last Pain:  Vitals:   05/27/16 0704  TempSrc: Oral      Patients Stated Pain Goal: 3 ([id number])  Complications: No apparent anesthesia complications

## 2016-05-27 NOTE — H&P (View-Only) (Signed)
Consult Note: Gyn-Onc  Consult was requested by Dr. Marvel Plan for the evaluation of Tiffany Faulkner 36 y.o. female  CC:  Chief Complaint  Patient presents with  . adenocarcinoma    Assessment/Plan:  [name]  is a 36 y.o.  year old with clinical stage IB1 cervical cancer (adenocarcinoma).  I am recommending PET scan to evaluate for gross extra-cervical disease and to complete the staging.  If stage I disease is confirmed I recommend robotic radical hysterectomy and bilateral salpingectomy with lymph node dissection.   I discussed that she appears eligible for fertility sparing surgery with radical trachelectomy. I discussed what this involves and the potential for increased obstetric risks, necessary cesarean delivery and completion hysterectomy at a later date. The patient has determined that she has completed child bearing and desires definitive surgery with radical hysterectomy.  I discussed risks of the procedure including  bleeding, infection, damage to internal organs (such as bladder,ureters, bowels), blood clot, reoperation and rehospitalization. I discussed the nature of radical parametrial dissection and the increased risk for ureteral injury (including fistula and stricture) and bladder injury which might require additional procedures to fix. We also discussed neurologic injury to bladder and bowel function and the potential for persistent urinary retention and the need to catheterize.   Surgery is scheduled for 05/28/15.   HPI: Tiffany Faulkner is a 36 year old G1P1 who is seen in consultation at the request of Dr Paula Compton for adenocarcinoma of the cervix.  The patient was considering having a second child and went to see Dr Marvel Plan in November, 2017 for IUD removal. 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   The patient was seen for colposcopy with Dr Marvel Plan on 04/20/16 and a friable lesion  was seen on the face of the cervix. It was biopsied at 4 o'cloc, 2 o'clock, 11 and 7 o'clock as well as ECC and endometrial biopsies all of which confirmed adenocarcinoma.  She reports a prior pap smear in 2015 at the time of her prior delivery which was normal. 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  The patient is otherwise very healthy. She has a history of 1 prior preterm vaginal delivery but no abdominal surgeries.  She reports vaginal spotting but it has been present since 2015 when her IUD was placed.  She reports postcoital bleeding for approximately 3 years (no recent change).   Current Meds:  No outpatient encounter prescriptions on file as of 05/05/2016.   No facility-administered encounter medications on file as of 05/05/2016.     Allergy: Not on File  Social Hx:   Social History   Social History  . Marital status: Married    Spouse name: N/A  . Number of children: N/A  . Years of education: N/A   Occupational History  . Not on file.   Social History Main Topics  . Smoking status: Never Smoker  . Smokeless tobacco: Never Used  . Alcohol use No  . Drug use: No  . Sexual activity: Not on file   Other Topics Concern  . Not on file   Social History Narrative  . No narrative on file    Past Surgical Hx: History reviewed. No pertinent surgical history.  Past Medical Hx: History reviewed. No pertinent past medical history.  Past Gynecological History:  SVD x 1 (preterm at 28 weeks) No  LMP recorded.  Family Hx: History reviewed. No pertinent family history.  Review of Systems:  Constitutional  Feels well,    ENT Normal appearing ears and nares bilaterally Skin/Breast  No rash, sores, jaundice, itching, dryness Cardiovascular  No chest pain, shortness of breath, or edema  Pulmonary  No cough or wheeze.  Gastro Intestinal  No nausea, vomitting, or diarrhoea. No bright red blood per rectum, no  abdominal pain, change in bowel movement, or constipation.  Genito Urinary  No frequency, urgency, dysuria, + bleeding and postcoital spotting Musculo Skeletal  No myalgia, arthralgia, joint swelling or pain  Neurologic  No weakness, numbness, change in gait,  Psychology  No depression, anxiety, insomnia.   Vitals:  Blood pressure 126/75, pulse 80, temperature 97.7 F (36.5 C), temperature source Oral, resp. rate (!) 158, height 5' 3.5" (1.613 m), weight 130 lb 9.6 oz (59.2 kg), SpO2 100 %.  Physical Exam: WD in NAD Neck  Supple NROM, without any enlargements.  Lymph Node Survey No cervical supraclavicular or inguinal adenopathy Cardiovascular  Pulse normal rate, regularity and rhythm. S1 and S2 normal.  Lungs  Clear to auscultation bilateraly, without wheezes/crackles/rhonchi. Good air movement.  Skin  No rash/lesions/breakdown  Psychiatry  Alert and oriented to person, place, and time  Abdomen  Normoactive bowel sounds, abdomen soft, non-tender and thin without evidence of hernia.  Back No CVA tenderness Genito Urinary  Vulva/vagina: Normal external female genitalia.   No lesions. No discharge or bleeding.  Bladder/urethra:  No lesions or masses, well supported bladder  Vagina: normal  Cervix: 3.5cm circumferential, central friable white lesion of endocervix/ectropion  Uterus:  Small, mobile, no parametrial involvement or nodularity.  Adnexa: no discrete masses. Rectal  Good tone, no masses no cul de sac nodularity. No parametrial involvement. Extremities  No bilateral cyanosis, clubbing or edema.   Donaciano Eva, MD  05/05/2016, 11:01 AM

## 2016-05-27 NOTE — Interval H&P Note (Signed)
History and Physical Interval Note:  05/27/2016 8:52 AM  [name]  has presented today for surgery, with the diagnosis of ADENOD CARCINOMA  The various methods of treatment have been discussed with the patient and family. After consideration of risks, benefits and other options for treatment, the patient has consented to  Procedure(s): XI ROBOTIC ASSISTED RADICAL  HYSTERECTOMY (N/A) BILATERAL SALPINGECTOMY (Bilateral) LYMPH NODE DISSECTION (Bilateral) as a surgical intervention .  The patient's history has been reviewed, patient examined, no change in status, stable for surgery.  I have reviewed the patient's chart and labs.  PET showed no gross metastatic disease. Questions were answered to the patient's satisfaction.     Tiffany Faulkner

## 2016-05-27 NOTE — Op Note (Signed)
OPERATIVE NOTE 05/27/17  Surgeon: Donaciano Eva   Assistants: Dr  Lahoma Crocker (an MD assistant was necessary for tissue manipulation, management of robotic instrumentation, retraction and positioning due to the complexity of the case and hospital policies).   Anesthesia: General endotracheal anesthesia  ASA Class: 2   Pre-operative Diagnosis: stage IB1 cervical adenocarcinoma  Post-operative Diagnosis: same  Operation: Robotic-assisted type III radical laparoscopic hysterectomy with bilateral salpingoophorectomy and bilateral pelvic lymphadenectomy  Surgeon: Donaciano Eva  Assistant Surgeon: Lahoma Crocker MD  Anesthesia: GET  Urine Output: 300cc  Operative Findings:  : 3cm cervical tumor (central and anteriorally). No suspicious nodes, normal ovaries bilaterally.  Estimated Blood Loss:  less than 50 mL      Total IV Fluids: 900 ml         Specimens: uterus, cervix with upper vagina, bilateral tubes; left and right pelvic lymph nodes         Complications:  None; patient tolerated the procedure well.         Disposition: PACU - hemodynamically stable.  Procedure Details  The patient was seen in the Holding Room. The risks, benefits, complications, treatment options, and expected outcomes were discussed with the patient.  The patient concurred with the proposed plan, giving informed consent.  The site of surgery properly noted/marked. The patient was identified as Hospital doctor and the procedure verified as a Robotic-assisted radical hysterectomy with bilateral salpingo oophorectomy and bilateral pelvic lymphadenectomy. A Time Out was held and the above information confirmed.  After induction of anesthesia, the patient was draped and prepped in the usual sterile manner. Pt was placed in supine position after anesthesia and draped and prepped in the usual sterile manner. The abdominal drape was placed after the CholoraPrep had been allowed to dry for 3  minutes.  Her arms were tucked to her side with all appropriate precautions.  The chest was secured to the table.  The patient was placed in the semi-lithotomy position in [place].  The perineum was prepped with Betadine.  Foley catheter was placed.  An EEA sizer (large size) was placed vaginally to define the vaginal fornices.  A second time-out was performed.  OG tube placement was confirmed and to suction.    Procedure:  The patient was brought to the operating room where general anesthesia was administered with no complications.  The patient was placed in the dorsal lithotomy position in padded Allen stirrups.  The arms were tucked at the sides with gel pads protecting the elbows and foam protecting the hands. The patient was then prepped.  A Foley was placed to gravity. An EEA sizer was placed in the vaginal fornices.  The patient was then draped in the normal manner.  Next, a 5 mm skin incision was made 1 cm below the subcostal margin in the midclavicular line.  The 5 mm Optiview port and scope was used for direct entry.  Opening pressure was under 10 mm CO2.  The abdomen was insufflated and the findings were noted as above.   At this point and all points during the procedure, the patient's intra-abdominal pressure did not exceed 15 mmHg. Next, a 10 mm skin incision was made at the umbilicus and a right and left port was placed about 10 cm lateral to the robot port on the right and left side.  A fourth arm was placed in the left lower quadrant 2 cm above and superior and medial to the anterior superior iliac spine.  All  ports were placed under direct visualization.  The patient was placed in steep Trendelenburg.  Bowel was away into the upper abdomen.  The robot was docked in the normal manner.  The right retroperitoneum in the pelvis was opened parallel to the IP ligament. The right paravesical space was developed with monopolar and sharp dissection. It was held open with tension on the median  umbilical ligament with the forth arm. The pararectal space was opened with blunt and sharp dissection to mobilize the ureter off of the medial surface of the internal iliac artery. The medial leaf of the broad ligament containing the ureter was held medially (opening the pararectal space) by the assistant's grasper. The right pelvic lymphadenectomy was performed by skeletonizing the internal iliac artery at the bifurcation with the external iliac artery. The obturator nerve was identified in the base of lateral paravesical space. The ureter was mobilized medially off of the dissection by developing the pararectal space. The genitofemoral nerve was identified, skeletonized and mobilized laterally off of the external iliac artery. An enbloc resection of lymph nodes was performed within the following boundaries: the mid portion of the common iliac proximally, the circumflex iliac vein distally, the obturator nerve posteriorally, the genitofemoral nerve laterally. The nodal basin (including obturator space) were confirmed to be empty of nodes and hemostatic. The nodes were placed in an endocatch bag and retrieved at the end of the procedure vaginally.  The same procedure with the same steps was performed on the patient's left side. The left retroperitoneum in the pelvis was opened parallel to the IP ligament. The left paravesical space was developed with monopolar and sharp dissection. It was held open with tension on the median umbilical ligament with the forth arm. The pararectal space was opened with blunt and sharp dissection to mobilize the ureter off of the medial surface of the internal iliac artery. The medial leaf of the broad ligament containing the ureter was held medially (opening the pararectal space) by the assistant's grasper. The left pelvic lymphadenectomy was performed by skeletonizing the internal iliac artery at the bifurcation with the external iliac artery. The obturator nerve was identified in  the base of lateral paravesical space. The ureter was mobilized medially off of the dissection by developing the pararectal space. The genitofemoral nerve was identified, skeletonized and mobilized laterally off of the external iliac artery. An enbloc resection of lymph nodes was performed within the following boundaries: the mid portion of the common iliac proximally, the circumflex iliac vein distally, the obturator nerve posteriorally, the genitofemoral nerve laterally. The nodal basin (including obturator space) were confirmed to be empty of nodes and hemostatic. The nodes were placed in an endocatch bag and retrieved at the end of the procedure vaginally.  The radical hysterectomy was begun by first skeletonizing the right uterine artery at its origin from the internal iliac artery by skeletonizing it 360 degrees and defining the parauterine web between the paravesical and pararectal spaces. The right ureter was skeletonized off of its attachments to the broad ligament and mobilized laterally. Using meticulous blunt and sparing monopolar dissection in short controlled bursts, the right ureter was untunnelled from under the right uterine artery. The anterior vessicouterine ligament was developed and the bladder flap was taken down to below the level of the tumor and below the rounded portion of the EEA sizer. This then definied the anterior bladder pillar. The uterine artery was bipolar fulgarated to seal it, then transected. The uterine vein was also sealed and resected. The  lateral boundary of the parametrium was taken down with biploar and monopolar dissection to the level of the deep vaginal vein. The uterine vessels were retracted superior and medially over the ureter on the right. The ureter was untunnelled through to its entry into the bladder with meticulous sharp dissection and short bursts of monopolar energy. The ureter was dissected off of its attachments to the anterior vagina. The anterior  bladder pillar was skeletonized and sealed with bipolar energy while the ureter was deflected distally. The posterior bladder pillar was also dissected with monopolar scissors from the upper vagina.  The rectovaginal septum was entered posteriorally with sharp dissection and the rectum was dissected off of the vagina. A window was created in the broad ligament with care to mobilize the ureter laterally. This skeletonized the IP ligament which was sealed with bipolar energy and transected.The right uterosacral ligament was transected with bipolar and monopolar energy 1/2 to 2/3rds of the way towards the uterosacral ligament insertion into the sacrum. In doing so meticulous attention was made to identify and wherever possible, spare the hypogastric nerves. The right paravaginal tissues were tubularized around the vagina on the right at the inferior boundary of the dissection.  The left radical hysterectomy was then performed by first skeletonizing the left uterine artery at its origin from the internal iliac artery by skeletonizing it 360 degrees and defining the parauterine web between the paravesical and pararectal spaces. The left ureter was skeletonized off of its attachments to the broad ligament and mobilized laterally. Using meticulous blunt and sparing monopolar dissection in short controlled bursts, the left ureter was untunnelled from under the left uterine artery. The left anterior vessicouterine ligament was developed and the bladder flap was taken down to below the level of the tumor and below the rounded portion of the EEA sizer. This then definied the anterior bladder pillar. The uterine artery was bipolar fulgarated to seal it, then transected. The uterine vein was also sealed and resected. The lateral boundary of the parametrium was taken down with biploar and monopolar dissection to the level of the deep vaginal vein. The uterine vessels were retracted superior and medially over the ureter on the  right. The ureter was untunnelled through to its entry into the bladder with meticulous sharp dissection and short bursts of monopolar energy. The ureter was dissected off of its attachments to the anterior vagina. The anterior bladder pillar was skeletonized and sealed with bipolar energy while the ureter was deflected distally. The posterior bladder pillar was also dissected with monopolar scissors from the upper vagina.  The rectovaginal septum was entered posteriorally with sharp dissection and the rectum was dissected off of the vagina. A window was created in the broad ligament with care to mobilize the ureter laterally. This skeletonized the IP ligament which was sealed with bipolar energy and transected.The left uterosacral ligament was transected with bipolar and monopolar energy 1/2 to 2/3rds of the way towards the uterosacral ligament insertion into the sacrum. In doing so meticulous attention was made to identify and wherever possible, spare the hypogastric nerves. The left paravaginal tissues were tubularized around the vagina on the left at the inferior boundary of the dissection.  The colpotomy was made and the uterus, cervix, bilateral ovaries and tubes were amputated and delivered through the vagina.  Pedicles were inspected and excellent hemostasis was achieved.    The colpotomy at the vaginal cuff was closed with two 0-Vicryl on a CT1 needle in a running manner.  Irrigation was  used and excellent hemostasis was achieved.  At this point in the procedure was completed.  Robotic instruments were removed under direct visulaization.  The robot was undocked. The 10 mm ports were closed with Vicryl on a UR-5 needle and the fascia was closed with 0 Vicryl on a UR-5 needle.  The skin was closed with 4-0 Vicryl in a subcuticular manner.  Dermabond was applied.  Sponge, lap and needle counts correct x 2.  The patient was taken to the recovery room in stable condition.  The vagina was swabbed with   minimal bleeding noted.   All instrument and needle counts were correct x  3.   The patient was transferred to the recovery room in a stable condition.  Donaciano Eva, MD

## 2016-05-28 ENCOUNTER — Encounter (HOSPITAL_COMMUNITY): Payer: Self-pay | Admitting: Gynecologic Oncology

## 2016-05-28 DIAGNOSIS — C539 Malignant neoplasm of cervix uteri, unspecified: Secondary | ICD-10-CM | POA: Diagnosis not present

## 2016-05-28 LAB — CBC
HCT: 31.9 % — ABNORMAL LOW (ref 36.0–46.0)
HEMOGLOBIN: 11.2 g/dL — AB (ref 12.0–15.0)
MCH: 32.6 pg (ref 26.0–34.0)
MCHC: 35.1 g/dL (ref 30.0–36.0)
MCV: 92.7 fL (ref 78.0–100.0)
Platelets: 182 10*3/uL (ref 150–400)
RBC: 3.44 MIL/uL — AB (ref 3.87–5.11)
RDW: 12.7 % (ref 11.5–15.5)
WBC: 8.5 10*3/uL (ref 4.0–10.5)

## 2016-05-28 LAB — BASIC METABOLIC PANEL
ANION GAP: 5 (ref 5–15)
BUN: 7 mg/dL (ref 6–20)
CHLORIDE: 111 mmol/L (ref 101–111)
CO2: 22 mmol/L (ref 22–32)
CREATININE: 0.51 mg/dL (ref 0.44–1.00)
Calcium: 8.2 mg/dL — ABNORMAL LOW (ref 8.9–10.3)
GFR calc non Af Amer: >60 mL/min (ref >60)
Glucose, Bld: 108 mg/dL — ABNORMAL HIGH (ref 65–99)
POTASSIUM: 3.8 mmol/L (ref 3.5–5.1)
SODIUM: 138 mmol/L (ref 135–145)

## 2016-05-28 MED ORDER — IBUPROFEN 800 MG PO TABS: 800.0000 mg | ORAL_TABLET | Freq: Three times a day (TID) | ORAL | 0 refills | Status: DC | PRN

## 2016-05-28 MED ORDER — SENNA 8.6 MG PO TABS: 1.0000 | ORAL_TABLET | Freq: Every day | ORAL | 0 refills | Status: DC

## 2016-05-28 MED ORDER — OXYCODONE-ACETAMINOPHEN 5-325 MG PO TABS: 1.0000 | ORAL_TABLET | ORAL | 0 refills | Status: DC | PRN

## 2016-05-28 NOTE — Progress Notes (Signed)
D/c'D home with family via w/c.Foley left inh and connected with a leg bag.D/c instructions read and signed

## 2016-05-28 NOTE — Discharge Instructions (Signed)
05/27/2016  Return to work: 4 weeks  Activity: 1. Be up and out of the bed during the day.  Take a nap if needed.  You may walk up steps but be careful and use the hand rail.  Stair climbing will tire you more than you think, you may need to stop part way and rest.   2. No lifting or straining for 6 weeks.  3. No driving for 1 weeks.  Do Not drive if you are taking narcotic pain medicine.  4. Shower daily.  Use soap and water on your incision and pat dry; don't rub.   5. No sexual activity and nothing in the vagina for 8 weeks.  Diet: 1. Low sodium Heart Healthy Diet is recommended.  2. It is safe to use a laxative if you have difficulty moving your bowels.   Medications:   Medications:  - Take ibuprofen and tylenol first line for pain control. Take these regularly (every 6 hours) to decrease the build up of pain.  - If necessary, for severe pain not relieved by ibuprofen, take percocet.  - While taking percocet you should take sennakot every night to reduce the likelihood of constipation. If this causes diarrhea, stop its use.   Wound Care: 1. Keep clean and dry.  Shower daily.  Reasons to call the Doctor:   Fever - Oral temperature greater than 100.4 degrees Fahrenheit  Foul-smelling vaginal discharge  Difficulty urinating  Nausea and vomiting  Increased pain at the site of the incision that is unrelieved with pain medicine.  Difficulty breathing with or without chest pain  New calf pain especially if only on one side  Sudden, continuing increased vaginal bleeding with or without clots.   Follow-up: 1. See Everitt Amber in 3 weeks.  2. See Joylene John in 1 week for foley catheter removal.  Contacts: For questions or concerns you should contact:  Dr. Everitt Amber at [phone]  or at [place]

## 2016-05-28 NOTE — Discharge Summary (Signed)
Physician Discharge Summary  Patient ID: Tiffany Faulkner MRN: [id number] DOB/AGE: 1980/06/16 36 y.o.  Admit date: 05/27/2016 Discharge date: 05/28/2016  Admission Diagnoses: Cervical cancer, FIGO stage IB1 [facility])  Discharge Diagnoses:  Principal Problem:   Cervical cancer, FIGO stage IB1 ([place]) Active Problems:   Cervical cancer [facility])   Discharged Condition: good  Hospital Course: patient was admitted 1/118/18 for robotic radical hysterectomy and bilateral pelvic lymphadenectomy. Surgery was uncomplicated. Postoperatively she did well and was meeting discharge criteria on POD 1.   Foley in situ, and patient will be discharged with foley due to radical parametrial resection and risk for urinary retention. Will plan for foley removal in office 1 week postop.  Consults: None  Significant Diagnostic Studies: labs:  CBC    Component Value Date/Time   WBC 8.5 05/28/2016 0435   RBC 3.44 (L) 05/28/2016 0435   HGB 11.2 (L) 05/28/2016 0435   HCT 31.9 (L) 05/28/2016 0435   PLT 182 05/28/2016 0435   MCV 92.7 05/28/2016 0435   MCH 32.6 05/28/2016 0435   MCHC 35.1 05/28/2016 0435   RDW 12.7 05/28/2016 0435   LYMPHSABS 1.8 05/24/2016 1135   MONOABS 0.3 05/24/2016 1135   EOSABS 0.1 05/24/2016 1135   BASOSABS 0.0 05/24/2016 1135     Treatments: surgery: see above  Discharge Exam: Blood pressure 98/61, pulse 72, temperature 98.2 F (36.8 C), temperature source Oral, resp. rate 14, height 5' 3.5" (1.613 m), weight 128 lb (58.1 kg), last menstrual period 05/07/2016, SpO2 99 %. General appearance: alert and cooperative GI: soft, non-tender; bowel sounds normal; no masses,  no organomegaly  Disposition: Final discharge disposition not confirmed  Discharge Instructions    (HEART FAILURE PATIENTS) Call MD:  Anytime you have any of the following symptoms: 1) 3 pound weight gain in 24 hours or 5 pounds in 1 week 2) shortness of breath, with or without a dry hacking cough 3) swelling in the  hands, feet or stomach 4) if you have to sleep on extra pillows at night in order to breathe.    Complete by:  As directed    Call MD for:  difficulty breathing, headache or visual disturbances    Complete by:  As directed    Call MD for:  extreme fatigue    Complete by:  As directed    Call MD for:  hives    Complete by:  As directed    Call MD for:  persistant dizziness or light-headedness    Complete by:  As directed    Call MD for:  persistant nausea and vomiting    Complete by:  As directed    Call MD for:  redness, tenderness, or signs of infection (pain, swelling, redness, odor or green/yellow discharge around incision site)    Complete by:  As directed    Call MD for:  severe uncontrolled pain    Complete by:  As directed    Call MD for:  temperature >100.4    Complete by:  As directed    Diet - low sodium heart healthy    Complete by:  As directed    Diet general    Complete by:  As directed    Driving Restrictions    Complete by:  As directed    No driving for 7 days or until off narcotic pain medication   Increase activity slowly    Complete by:  As directed    Remove dressing in 24 hours    Complete by:  As directed    Sexual Activity Restrictions    Complete by:  As directed    No intercourse for 6 weeks     Allergies as of 05/28/2016   No Known Allergies     Medication List    TAKE these medications   ibuprofen 800 MG tablet Commonly known as:  ADVIL,MOTRIN Take 1 tablet (800 mg total) by mouth every 8 (eight) hours as needed (mild pain).   oxyCODONE-acetaminophen 5-325 MG tablet Commonly known as:  PERCOCET/ROXICET Take 1-2 tablets by mouth every 4 (four) hours as needed (moderate to severe pain (when tolerating fluids)).   senna 8.6 MG Tabs tablet Commonly known as:  SENOKOT Take 1 tablet (8.6 mg total) by mouth at bedtime.      Follow-up Information    CROSS, MELISSA DEAL, NP In 1 week.   Specialty:  Gynecologic Oncology Why:  for foley  removal Contact information: [place] [address] [phone]        Donaciano Eva, MD In 3 weeks.   Specialty:  Obstetrics and Gynecology Contact information: [place] [address] [phone]           Signed: Donaciano Eva 05/28/2016, 8:57 AM

## 2016-06-03 ENCOUNTER — Ambulatory Visit: Payer: BLUE CROSS/BLUE SHIELD | Attending: Gynecologic Oncology | Admitting: Gynecologic Oncology

## 2016-06-03 ENCOUNTER — Encounter: Payer: Self-pay | Admitting: Gynecologic Oncology

## 2016-06-03 VITALS — BP 121/74 | HR 66 | Temp 98.1°F | Resp 16

## 2016-06-03 DIAGNOSIS — Z79899 Other long term (current) drug therapy: Secondary | ICD-10-CM | POA: Insufficient documentation

## 2016-06-03 DIAGNOSIS — Z9889 Other specified postprocedural states: Secondary | ICD-10-CM | POA: Insufficient documentation

## 2016-06-03 DIAGNOSIS — G8918 Other acute postprocedural pain: Secondary | ICD-10-CM

## 2016-06-03 DIAGNOSIS — C530 Malignant neoplasm of endocervix: Secondary | ICD-10-CM | POA: Insufficient documentation

## 2016-06-03 DIAGNOSIS — C539 Malignant neoplasm of cervix uteri, unspecified: Secondary | ICD-10-CM

## 2016-06-03 MED ORDER — IBUPROFEN 800 MG PO TABS: 800.0000 mg | ORAL_TABLET | Freq: Three times a day (TID) | ORAL | 0 refills | Status: DC | PRN

## 2016-06-03 MED ORDER — OXYCODONE-ACETAMINOPHEN 5-325 MG PO TABS: 1.0000 | ORAL_TABLET | ORAL | 0 refills | Status: DC | PRN

## 2016-06-03 NOTE — Patient Instructions (Addendum)
Doing well post-op.  Continue with taking medications for your bowels and back off if diarrhea develops.  Please call the office at [phone] or the after hours number at [phone] to speak with the GYN Oncologist on call if you develop any issues with urination, increased lower abdominal pressure, etc.     Clean Intermittent Catheterization, Female Introduction Clean intermittent catheterization (CIC) is a procedure to remove urine from the bladder by placing a small, flexible tube (catheter) into the bladder though the urethra. The urethra is a tube in the body that carries urine from the bladder out of the body. CIC may be done when:  You cannot completely empty your bladder on your own. This may be due to a blockage in the bladder or urethra.  Your bladder leaks urine. This may happen when the muscles or nerves near the bladder are not working normally, so the bladder overflows. Your health care provider will show you how to perform CIC and will help you to feel comfortable performing this procedure at home. Your health care provider will also help you to get the home care supplies that are needed for this procedure. What supplies will I need?  Germ-free (sterile), water-based lubricant.  A container for urine collection. You may also use the toilet to dispose of urine from the catheter.  A catheter. Your health care provider will determine the best size for you.  Sterile gloves.  Sterile gauze.  Medicated sterile swabs. How do I perform the procedure? Most people need CIC at least 4 times per dayto adequately empty the bladder. Your health care provider will tell you how often you should perform CIC. To perform CIC, follow these steps: 1. Wash your hands with soap and water. If soap and water are not available, use hand sanitizer. 2. Prepare the supplies that you will use during the procedure. Open the catheter pack, the lubricant, and the pack of medicated sterile swabs. If  you have been told to keep the procedure sterile, do not touch your supplies until you are wearing gloves. 3. Get in a comfortable position. It may be helpful to use a handheld mirror to look at the opening of your urethra. Possible positions include:  Sitting on a toilet, a chair, or the edge of a bed.  Standing next to a toilet with one foot on the toilet rim.  Lying down with your head raised on pillows and your knees pointing to the ceiling. 4. If you are using a urine collection container, position it between your legs. 5. Urinate, if you are able. 6. Put on gloves. 7. Apply lubricant to about 2 inches (5 cm) of the tip of the catheter. 8. Set the catheter down on a clean, dry surface within reach. 9. Gently spread the folds of skin around your vagina (labia) with your non-dominant hand. For example, if you are right-handed, use your left hand to do this. With your other hand, clean the area around your urethra with medicated sterile swabs as told by your health care provider. 10. While keeping your labia spread apart, slowly insert the lubricated catheter 2-3 inches (5-8 cm) straight into your urethra until urine flows freely. Allow urine to drain into the toilet or the urine collection container. 11. When urine starts to flow freely, insert the catheter 1 inch (3 cm) more. 12. When urine stops flowing, slowly remove the catheter. 13. Note the color, amount, and odor of the urine. 14. Clean your labia using soap and water. 15.  Wash your hands with soap and water. 16. Follow package instructions about how to clean the catheter after each use. What should I do at home? How Often Should I Perform CIC?  Do CIC to empty your bladder every 4-6 hours or as often as told by your health care provider.  If you have symptoms of too much urine in your bladder (overdistension) and you are not able to urinate, perform CIC. Symptoms of overdistension may include:  Restlessness.  Sweating or  chills.  Headache.  Flushedor pale skin.  Cold limbs.  Bloated lower abdomen. What Are Some Steps That I Can Take to Avoid Problems?  Drink enough fluid to keep your urine clear or pale yellow.  Avoid caffeine. Caffeine may make you urinate more frequently and more urgently.  Dispose of a multiple-use catheter when it becomes dry, brittle, or cloudy. This usually happens after you use the catheter for 1 week.  Take over-the-counter and prescription medicines only as told by your health care provider.  Keep all follow-up visits as told by your health care provider. This is important. Contact a health care provider if:  You have difficulty performing CIC.  You have urine leaking during CIC.  You have:  Dark or cloudy urine.  Blood in your urine or in your catheter.  A change in the smell of your urine or discharge.  A burning feeling while you urinate.  You feel nauseous or you vomit.  You have pain in your abdomen, your back, or your sides below your ribs.  You have swelling or redness around the opening of your urethra.  You develop a rash or sores on your skin. Get help right away if:  You have a fever.  You have symptoms that do not go away after 3 days.  You have symptoms that suddenly get worse.  You have severe pain.  The amount of urine that drains from your bladder decreases. This information is not intended to replace advice given to you by your health care provider. Make sure you discuss any questions you have with your health care provider. Document Released: 05/29/2010 Document Revised: 10/02/2015 Document Reviewed: 11/08/2014  2017 Elsevier

## 2016-06-03 NOTE — Progress Notes (Signed)
Follow Up Note: Gyn-Onc  Tiffany Faulkner 36 y.o. female  CC:  Chief Complaint  Patient presents with  . Routine Post Op    Follow up foley removal, trial voiding    HPI:  Tiffany Faulkner is a 36 year old female,G1P1, initially seen in consultation at the request of Dr Paula Compton for adenocarcinoma of the cervix.  After considering having a second child, the patient went to see Dr Marvel Plan in November, 2017 for IUD removal. At the time of the IUD removal (04/08/16), a pap was taken which revealed AGUS with high risk HPV present.  Colposcopy with Dr Marvel Plan was performed on 04/20/16 and a friable lesion was seen on the face of the cervix. It was biopsied at 4 o'clock, 2 o'clock, 11 and 7 o'clock as well as ECC and endometrial biopsies all of which confirmed adenocarcinoma.  Last pap smear in 2015 at the time of her prior delivery reported as normal. 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  On 05/24/16, she underwent a PET scan resulting: IMPRESSION: Hypermetabolic focus in cervix, consistent with known primary cervical carcinoma.  No evidence of pelvic nodal or distant metastatic disease.  She then proceeded with a Robotic-assisted type III radical laparoscopic hysterectomy with bilateral salpingoophorectomy and bilateral pelvic lymphadenectomy on 05/27/16.  Post-operative course was uneventful.  Final pathology resulted:  Diagnosis 1. Lymph nodes, regional resection, right pelvic - FIFTEEN BENIGN LYMPH NODES WITH NO TUMOR SEEN (0/15). 2. Lymph nodes, regional resection, left pelvic - FIFTEEN BENIGN LYMPH NODES WITH NO TUMOR SEEN (0/15). 3. 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 - TUMOR MEASURES 2 CM IN GREATEST DIMENSION AND IS CONFINED TO THE CERVIX. - MARGINS ARE NEGATIVE. - SEE ONCOLOGY  TEMPLATE. ADDITIONAL FINDINGS: - BENIGN UTERUS WITH SECRETORY ENDOMETRIUM AND UNDERLYING UNREMARKABLE MYOMETRIUM. - BENIGN BILATERAL FALLOPIAN TUBES WITH BENIGN PARATUBAL CYST FORMATION ON THE LEFT. Microscopic Comment 3. UTERINE CERVIX Specimen: Uterus including cervix, bilateral parametrial tissue, vaginal cuff and bilateral tubes. Procedure: Robotic-assisted radical laparoscopic hysterectomy with bilateral salpingo-oophorectomy and bilateral pelvic lymphadenectomy. Tumor site (quadrant if known): Tumor involves anterior half of the cervix. Maximum tumor size (cm): 2 cm in greatest dimension (based on gross findings). Histologic type: Adenocarcinoma. Grade: Well-differentiated, low grade (grade I). Margins: Negative. Distance of carcinoma from closest margin: At least 1 cm (anterior vaginal cuff). Depth of invasion: 7 mm in depth where cervix is 17 mm in thickness, see comment. Horizontal extent (mm): 20 mm, see comment. Lymph-Vascular invasion: Not identified. Vaginal extension: Not identified. 1 of 3 FINAL for Hoe, Keegan [id number]) Microscopic Comment(continued) Uterine corpus extension: Not identified. Parametrial involvement: Not identified. Lymph nodes: number examined: 30 total (15 on right and 15 on left); number positive 0. TNM code: pT1b1, pN0. FIGO Stage (based on pathologic findings, need clinical correlation): IB1. Comment: The invasive endocervical adenocarcinoma has a very well differentiated appearance and the distinction between invasive adenocarcinoma and adenocarcinoma in situ is not obvious. However, there are extension of neoplastic glands beyond the deepest extension of normal endocervical crypts in other areas of the biopsy. In addition, there are other areas of tumor that demonstrate detached cell clusters of tumor. The findings are diagnostic for invasive adenocarcinoma. The deepest extension of neoplastic glands is 7 mm where the depth of the cervix is 17  mm (the depth of invasion is thus given as above). Dr. 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 (RH:ecj 06/01/2016)     Interval History:  She presents today with her husband for post-operative follow-up and foley removal with a voiding trial.  She reports doing well at home.  Tolerating diet with no nausea or emesis reported.  Mild constipation reported but taking over the counter medication for relief.  Taking ibuprofen for pain during the pain and percocet at night.  Adequate urine output reported from the foley at home with no hematuria, flank/back discomfort, fever, or chills.  Ambulating without difficulty.  She is concerned about having to self cath and wants to know how long she would have to do it if she ends of having to do it.  No other concerns voiced.  Review of Systems: Constitutional: Feels well. No fever, chills, early satiety, change in appetite. Cardiovascular: No chest pain, shortness of breath, or edema.  Pulmonary: No cough or wheeze.  Gastrointestinal: No nausea, vomiting, or diarrhea. No bright red blood per rectum or change in bowel movement.  Genitourinary: No frequency, urgency, or dysuria. No vaginal bleeding or discharge.  Musculoskeletal: No myalgia or joint pain. Neurologic: No weakness, numbness, or change in gait.  Psychology: No depression, anxiety, or insomnia.  Current Meds:  Outpatient Encounter Prescriptions as of 06/03/2016  Medication Sig  . ibuprofen (ADVIL,MOTRIN) 800 MG tablet Take 1 tablet (800 mg total) by mouth every 8 (eight) hours as needed (mild pain).  Marland Kitchen oxyCODONE-acetaminophen (PERCOCET/ROXICET) 5-325 MG tablet Take 1-2 tablets by mouth every 4 (four) hours as needed (moderate to severe pain (when tolerating fluids)).  Marland Kitchen senna  (SENOKOT) 8.6 MG TABS tablet Take 1 tablet (8.6 mg total) by mouth at bedtime.  . [DISCONTINUED] ibuprofen (ADVIL,MOTRIN) 800 MG tablet Take 1 tablet (800 mg total) by mouth every 8 (eight) hours as needed (mild pain).  . [DISCONTINUED] oxyCODONE-acetaminophen (PERCOCET/ROXICET) 5-325 MG tablet Take 1-2 tablets by mouth every 4 (four) hours as needed (moderate to severe pain (when tolerating fluids)).   No facility-administered encounter medications on file as of 06/03/2016.     Allergy: No Known Allergies  Social Hx:   Social History   Social History  . Marital status: Married    Spouse name: N/A  . Number of children: N/A  . Years of education: N/A   Occupational History  . Not on file.   Social History Main Topics  . Smoking status: Never Smoker  . Smokeless tobacco: Never Used  . Alcohol use Yes     Comment: occasional   . Drug use: No  . Sexual activity: Not on file   Other Topics Concern  . Not on file   Social History Narrative  . No narrative on file    Past Surgical Hx:  Past Surgical History:  Procedure Laterality Date  . BILATERAL SALPINGECTOMY Bilateral 05/27/2016   Procedure: BILATERAL SALPINGECTOMY;  Surgeon: Everitt Amber, MD;  Location: [facility];  Service: Gynecology;  Laterality: Bilateral;  . LYMPH NODE DISSECTION Bilateral 05/27/2016   Procedure: LYMPH NODE DISSECTION;  Surgeon: Everitt Amber, MD;  Location: [facility];  Service: Gynecology;  Laterality: Bilateral;  . ROBOTIC ASSISTED TOTAL HYSTERECTOMY N/A 05/27/2016   Procedure: XI ROBOTIC ASSISTED RADICAL  HYSTERECTOMY,BILATERAL OVARIAN TRANSPOSITION;  Surgeon: Everitt Amber, MD;  Location: [facility];  Service: Gynecology;  Laterality: N/A;  Past Medical Hx: History reviewed. No pertinent past medical history.  Family Hx: History reviewed. No pertinent family history.  Vitals:  Blood pressure 121/74, pulse 66, temperature 98.1 F (36.7 C), temperature source Oral, resp. rate 16, last menstrual period 05/07/2016,  SpO2 99 %.  Physical Exam:  General: Well developed, well nourished female in no acute distress. Alert and oriented x 3.  Cardiovascular: Regular rate and rhythm. S1 and S2 normal.  Lungs: Clear to auscultation bilaterally. No wheezes/crackles/rhonchi noted.  Skin: No rashes or lesions present. Back: No CVA tenderness.  Abdomen: Abdomen soft, non-tender and non-obese. Active bowel sounds in all quadrants. Lap sites healing with no drainage or bleeding present.  Extremities: No bilateral cyanosis, edema, or clubbing.  Foley to leg bag with moderate amount of clear, yellow urine.  360 cc of sterile normal saline instilled in the bladder without difficulty per Dr. Denman George.  Patient with mild pressure reported but no urge to urinate.  She voided 50 cc at 11 am then another 200 cc at 11:30am still with no urge to urinate, just mild pressure.  Due to the decreased urge to urinate and with the knowledge that 360 cc was placed in the bladder, the patient was taught how to self cath by Clayborne Artist, RN.  At 12:00pm, patient was able to self cath without difficulty with 200 cc of clear, yellow urine out.  Patient reporting decrease in pressure sensation.  Supplies given and patient recommended to get additional supplies if needed through [employer].   Assessment/Plan: 36 year old s/p Robotic-assisted type III radical laparoscopic hysterectomy with bilateral salpingoophorectomy and bilateral pelvic lymphadenectomy on 05/27/16 for endocervical adenocarcinoma.  No adjuvant therapy per Dr. Denman George.  Final path given to the patient.  She has been educated on self catheterization and has been advised to monitor her output and to self cath 3-4 times a day as needed.  Since her urge to urinate is decreased, she is advised to pay attention to the feelings of pressure also experienced today.  Reportable signs and symptoms reviewed.  Educational materials given on self catheterization along with signs of urinary  retention.  Our office will touch base with her tomorrow and the after hours number reviewed.  Advised to call for any needs or concerns.     CROSS, MELISSA DEAL, NP 06/03/2016, 11:54 AM

## 2016-06-04 ENCOUNTER — Telehealth: Payer: Self-pay | Admitting: Gynecologic Oncology

## 2016-06-04 NOTE — Telephone Encounter (Addendum)
Spoke with patient about her current status.  She states she has not had to self cath because she has been voiding 300-450 cc every two hours.  She states she is doing well with no feelings of pressure, etc.  Advised to call for any needs or concerns.

## 2016-06-07 ENCOUNTER — Telehealth: Payer: Self-pay

## 2016-06-07 NOTE — Telephone Encounter (Signed)
Pt had indwelling cath removed on 06/03/16, experienced urinary retention at that time. Self catheterization taught. Pt stated that she did experience severe back pain yesterday and had difficulty doing self cath related to anxiety. Pt has been voiding every 2-3 hours and putting out 200-350 cc. Discussed that if pt experiences back pain again, try to void and self cath after to determine if she is retaining urine. Pt denies any fever. Verbalized understanding of information.

## 2016-06-08 ENCOUNTER — Other Ambulatory Visit (HOSPITAL_BASED_OUTPATIENT_CLINIC_OR_DEPARTMENT_OTHER): Payer: BLUE CROSS/BLUE SHIELD

## 2016-06-08 ENCOUNTER — Encounter: Payer: Self-pay | Admitting: Gynecologic Oncology

## 2016-06-08 ENCOUNTER — Ambulatory Visit: Payer: BLUE CROSS/BLUE SHIELD | Attending: Gynecologic Oncology | Admitting: Gynecologic Oncology

## 2016-06-08 ENCOUNTER — Ambulatory Visit (HOSPITAL_COMMUNITY)
Admission: RE | Admit: 2016-06-08 | Discharge: 2016-06-08 | Disposition: A | Discharge status: Home / Self Care | Payer: BLUE CROSS/BLUE SHIELD | Source: Ambulatory Visit | Attending: Gynecologic Oncology | Admitting: Gynecologic Oncology

## 2016-06-08 ENCOUNTER — Telehealth: Payer: Self-pay | Admitting: Gynecologic Oncology

## 2016-06-08 ENCOUNTER — Other Ambulatory Visit: Payer: Self-pay | Admitting: Gynecologic Oncology

## 2016-06-08 ENCOUNTER — Encounter (HOSPITAL_COMMUNITY): Payer: Self-pay

## 2016-06-08 VITALS — BP 110/59 | HR 108 | Temp 98.8°F | Resp 18

## 2016-06-08 DIAGNOSIS — Z9889 Other specified postprocedural states: Secondary | ICD-10-CM | POA: Insufficient documentation

## 2016-06-08 DIAGNOSIS — N10 Acute tubulo-interstitial nephritis: Secondary | ICD-10-CM | POA: Diagnosis not present

## 2016-06-08 DIAGNOSIS — C530 Malignant neoplasm of endocervix: Secondary | ICD-10-CM | POA: Insufficient documentation

## 2016-06-08 DIAGNOSIS — C539 Malignant neoplasm of cervix uteri, unspecified: Secondary | ICD-10-CM

## 2016-06-08 DIAGNOSIS — R8290 Unspecified abnormal findings in urine: Secondary | ICD-10-CM | POA: Diagnosis not present

## 2016-06-08 DIAGNOSIS — R6883 Chills (without fever): Secondary | ICD-10-CM | POA: Insufficient documentation

## 2016-06-08 DIAGNOSIS — R112 Nausea with vomiting, unspecified: Secondary | ICD-10-CM | POA: Diagnosis present

## 2016-06-08 DIAGNOSIS — Z79899 Other long term (current) drug therapy: Secondary | ICD-10-CM | POA: Diagnosis not present

## 2016-06-08 DIAGNOSIS — R1110 Vomiting, unspecified: Secondary | ICD-10-CM

## 2016-06-08 DIAGNOSIS — J90 Pleural effusion, not elsewhere classified: Secondary | ICD-10-CM | POA: Insufficient documentation

## 2016-06-08 DIAGNOSIS — R338 Other retention of urine: Secondary | ICD-10-CM | POA: Insufficient documentation

## 2016-06-08 DIAGNOSIS — N12 Tubulo-interstitial nephritis, not specified as acute or chronic: Secondary | ICD-10-CM | POA: Insufficient documentation

## 2016-06-08 DIAGNOSIS — R590 Localized enlarged lymph nodes: Secondary | ICD-10-CM | POA: Insufficient documentation

## 2016-06-08 DIAGNOSIS — R911 Solitary pulmonary nodule: Secondary | ICD-10-CM | POA: Insufficient documentation

## 2016-06-08 LAB — COMPREHENSIVE METABOLIC PANEL
ALBUMIN: 3 g/dL — AB (ref 3.5–5.0)
ALK PHOS: 84 U/L (ref 40–150)
ALT: 15 U/L (ref 0–55)
AST: 16 U/L (ref 5–34)
Anion Gap: 11 mEq/L (ref 3–11)
BUN: 12 mg/dL (ref 7.0–26.0)
CHLORIDE: 105 meq/L (ref 98–109)
CO2: 21 mEq/L — ABNORMAL LOW (ref 22–29)
Calcium: 9.2 mg/dL (ref 8.4–10.4)
Creatinine: 0.9 mg/dL (ref 0.6–1.1)
EGFR: 86 mL/min/{1.73_m2} — AB (ref >90)
GLUCOSE: 120 mg/dL (ref 70–140)
POTASSIUM: 3.7 meq/L (ref 3.5–5.1)
SODIUM: 137 meq/L (ref 136–145)
Total Bilirubin: 0.55 mg/dL (ref 0.20–1.20)
Total Protein: 6.5 g/dL (ref 6.4–8.3)

## 2016-06-08 LAB — URINALYSIS, MICROSCOPIC - CHCC
Bilirubin (Urine): NEGATIVE
GLUCOSE UR CHCC: NEGATIVE mg/dL
KETONES: NEGATIVE mg/dL
Nitrite: POSITIVE
PH: 6 (ref 4.6–8.0)
PROTEIN: 30 mg/dL
SPECIFIC GRAVITY, URINE: 1.01 (ref 1.003–1.035)
UROBILINOGEN UR: 0.2 mg/dL (ref 0.2–1)

## 2016-06-08 LAB — CBC WITH DIFFERENTIAL/PLATELET
BASO%: 0.1 % (ref 0.0–2.0)
BASOS ABS: 0 10*3/uL (ref 0.0–0.1)
EOS ABS: 0 10*3/uL (ref 0.0–0.5)
EOS%: 0 % (ref 0.0–7.0)
HCT: 32.3 % — ABNORMAL LOW (ref 34.8–46.6)
HEMOGLOBIN: 11.2 g/dL — AB (ref 11.6–15.9)
LYMPH%: 1.9 % — AB (ref 14.0–49.7)
MCH: 31.6 pg (ref 25.1–34.0)
MCHC: 34.7 g/dL (ref 31.5–36.0)
MCV: 91.2 fL (ref 79.5–101.0)
MONO#: 1.2 10*3/uL — ABNORMAL HIGH (ref 0.1–0.9)
MONO%: 9.4 % (ref 0.0–14.0)
NEUT#: 10.9 10*3/uL — ABNORMAL HIGH (ref 1.5–6.5)
NEUT%: 88.6 % — AB (ref 38.4–76.8)
Platelets: 258 10*3/uL (ref 145–400)
RBC: 3.54 10*6/uL — AB (ref 3.70–5.45)
RDW: 12.3 % (ref 11.2–14.5)
WBC: 12.3 10*3/uL — ABNORMAL HIGH (ref 3.9–10.3)
lymph#: 0.2 10*3/uL — ABNORMAL LOW (ref 0.9–3.3)

## 2016-06-08 MED ORDER — IOPAMIDOL (ISOVUE-300) INJECTION 61%
INTRAVENOUS | Status: AC
  Filled 2016-06-08: qty 100

## 2016-06-08 MED ORDER — SODIUM CHLORIDE 0.9 % IJ SOLN
INTRAMUSCULAR | Status: AC
  Filled 2016-06-08: qty 50

## 2016-06-08 MED ORDER — CIPROFLOXACIN HCL 500 MG PO TABS: 500.0000 mg | ORAL_TABLET | Freq: Two times a day (BID) | ORAL | 0 refills | Status: DC

## 2016-06-08 MED ORDER — IOPAMIDOL (ISOVUE-300) INJECTION 61%
INTRAVENOUS | Status: AC
  Filled 2016-06-08: qty 30

## 2016-06-08 MED ORDER — IOPAMIDOL (ISOVUE-300) INJECTION 61%
30.0000 mL | Freq: Once | INTRAVENOUS | Status: DC | PRN
  Administered 2016-06-08: 30 mL via ORAL
  Filled 2016-06-08: qty 30

## 2016-06-08 MED ORDER — IOPAMIDOL (ISOVUE-300) INJECTION 61%
100.0000 mL | Freq: Once | INTRAVENOUS | Status: AC | PRN
  Administered 2016-06-08: 100 mL via INTRAVENOUS

## 2016-06-08 NOTE — Progress Notes (Signed)
Patient seen for fever, nausea and emesis. Unable to void and getting nothing out with attempted self cath.  Foley replaced - 1300cc drained immediately.  CBC    Component Value Date/Time   WBC 12.3 (H) 06/08/2016 1104   WBC 8.5 05/28/2016 0435   RBC 3.54 (L) 06/08/2016 1104   RBC 3.44 (L) 05/28/2016 0435   HGB 11.2 (L) 06/08/2016 1104   HCT 32.3 (L) 06/08/2016 1104   PLT 258 06/08/2016 1104   MCV 91.2 06/08/2016 1104   MCH 31.6 06/08/2016 1104   MCH 32.6 05/28/2016 0435   MCHC 34.7 06/08/2016 1104   MCHC 35.1 05/28/2016 0435   RDW 12.3 06/08/2016 1104   LYMPHSABS 0.2 (L) 06/08/2016 1104   MONOABS 1.2 (H) 06/08/2016 1104   EOSABS 0.0 06/08/2016 1104   BASOSABS 0.0 06/08/2016 1104  . BMET    Component Value Date/Time   NA 137 06/08/2016 1105   K 3.7 06/08/2016 1105   CL 111 05/28/2016 0435   CO2 21 (L) 06/08/2016 1105   GLUCOSE 120 06/08/2016 1105   BUN 12.0 06/08/2016 1105   CREATININE 0.9 06/08/2016 1105   CALCIUM 9.2 06/08/2016 1105   GFRNONAA >60 05/28/2016 0435   GFRAA >60 05/28/2016 0435    CT abdo/pelvis: showed left pyelonephritis. No evidence of obstruction (GI or uro) or leak/collection.  Assessment/plan:  Left pyelonephritis. - urine culture sent (catheterized specimen) - empiric cipro 500 x 14 days. - foley to drainage x 2 weeks.

## 2016-06-08 NOTE — Progress Notes (Signed)
Follow Up Note: Gyn-Onc  Tiffany Faulkner 36 y.o. female  CC:  Chief Complaint  Patient presents with  . Post-op emesis    follow up post-op  . 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  After considering having a second child, the patient went to see Dr Marvel Plan in November, 2017 for IUD removal. At the time of the IUD removal (04/08/16), a pap was taken which revealed AGUS with high risk HPV present.  Colposcopy with Dr Marvel Plan was performed on 04/20/16 and a friable lesion was seen on the face of the cervix. It was biopsied at 4 o'clock, 2 o'clock, 11 and 7 o'clock as well as ECC and endometrial biopsies all of which confirmed adenocarcinoma.  Last pap smear in 2015 at the time of her prior delivery reported as normal. 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  On 05/24/16, she underwent a PET scan resulting: IMPRESSION: Hypermetabolic focus in cervix, consistent with known primary cervical carcinoma.  No evidence of pelvic nodal or distant metastatic disease.  She then proceeded with a Robotic-assisted type III radical laparoscopic hysterectomy with bilateral salpingoophorectomy and bilateral pelvic lymphadenectomy on 05/27/16.  Post-operative course was uneventful.  Final pathology resulted:  Diagnosis 1. Lymph nodes, regional resection, right pelvic - FIFTEEN BENIGN LYMPH NODES WITH NO TUMOR SEEN (0/15). 2. Lymph nodes, regional resection, left pelvic - FIFTEEN BENIGN LYMPH NODES WITH NO TUMOR SEEN (0/15). 3. 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 - TUMOR MEASURES 2 CM IN GREATEST DIMENSION AND IS CONFINED TO THE CERVIX. - MARGINS ARE NEGATIVE. - SEE ONCOLOGY  TEMPLATE. ADDITIONAL FINDINGS: - BENIGN UTERUS WITH SECRETORY ENDOMETRIUM AND UNDERLYING UNREMARKABLE MYOMETRIUM. - BENIGN BILATERAL FALLOPIAN TUBES WITH BENIGN PARATUBAL CYST FORMATION ON THE LEFT. Microscopic Comment 3. UTERINE CERVIX Specimen: Uterus including cervix, bilateral parametrial tissue, vaginal cuff and bilateral tubes. Procedure: Robotic-assisted radical laparoscopic hysterectomy with bilateral salpingo-oophorectomy and bilateral pelvic lymphadenectomy. Tumor site (quadrant if known): Tumor involves anterior half of the cervix. Maximum tumor size (cm): 2 cm in greatest dimension (based on gross findings). Histologic type: Adenocarcinoma. Grade: Well-differentiated, low grade (grade I). Margins: Negative. Distance of carcinoma from closest margin: At least 1 cm (anterior vaginal cuff). Depth of invasion: 7 mm in depth where cervix is 17 mm in thickness, see comment. Horizontal extent (mm): 20 mm, see comment. Lymph-Vascular invasion: Not identified. Vaginal extension: Not identified. 1 of 3 FINAL for Tiffany Faulkner, Tiffany Faulkner [id number]) Microscopic Comment(continued) Uterine corpus extension: Not identified. Parametrial involvement: Not identified. Lymph nodes: number examined: 30 total (15 on right and 15 on left); number positive 0. TNM code: pT1b1, pN0. FIGO Stage (based on pathologic findings, need clinical correlation): IB1. Comment: The invasive endocervical adenocarcinoma has a very well differentiated appearance and the distinction between invasive adenocarcinoma and adenocarcinoma in situ is not obvious. However, there are extension of neoplastic glands beyond the deepest extension of normal endocervical crypts in other areas of the biopsy. In addition, there are other areas of tumor that demonstrate detached cell clusters of tumor. The findings are diagnostic for invasive adenocarcinoma. The deepest extension of neoplastic glands is 7 mm where the depth of the cervix is 17  mm (the depth of invasion is thus given as above). Dr. 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 (RH:ecj 06/01/2016)     Interval History:  She presents today with her husband for evaluation of feeling "miserable", chills, vomiting, pain with urination.  Reporting passing flatus but no bowel movement reported.  Reports having chills but has not taken her temperature.  Continuing to taking ibuprofen for pain during the pain and percocet at night.  She states she has tried to use the catheter after urinating and states she has it "in a hole but nothing comes out."  She is becoming increasing frustrated with having to use the catheter.  She started feeling poorly yesterday.  She had been voiding every two hours around 300-400cc and states when she went to self cath nothing came out.  Ambulating without difficulty.  No other concerns voiced.  Review of Systems: Constitutional: Feels poorly.  Positive for chills.  No documented fever until today in the office at 100.1. Cardiovascular: No chest pain, shortness of breath, or edema.  Pulmonary: No cough or wheeze.  Gastrointestinal: Positive for emesis.  No nausea or diarrhea. No bright red blood per rectum or change in bowel movement.  Genitourinary: Positive for discomfort with urination.  No frequency, urgency, or dysuria. No vaginal bleeding or discharge.  Musculoskeletal: No myalgia or joint pain. Neurologic: No weakness, numbness, or change in gait.  Psychology: No depression, anxiety, or insomnia.  Current Meds:  Outpatient Encounter Prescriptions as of 06/08/2016  Medication Sig  . ciprofloxacin (CIPRO) 500 MG tablet Take 1 tablet (500 mg total) by mouth 2 (two) times daily.  Marland Kitchen ibuprofen (ADVIL,MOTRIN) 800 MG tablet Take 1 tablet  (800 mg total) by mouth every 8 (eight) hours as needed (mild pain).  Marland Kitchen oxyCODONE-acetaminophen (PERCOCET/ROXICET) 5-325 MG tablet Take 1-2 tablets by mouth every 4 (four) hours as needed (moderate to severe pain (when tolerating fluids)).  Marland Kitchen senna (SENOKOT) 8.6 MG TABS tablet Take 1 tablet (8.6 mg total) by mouth at bedtime.   No facility-administered encounter medications on file as of 06/08/2016.     Allergy: No Known Allergies  Social Hx:   Social History   Social History  . Marital status: Married    Spouse name: N/A  . Number of children: N/A  . Years of education: N/A   Occupational History  . Not on file.   Social History Main Topics  . Smoking status: Never Smoker  . Smokeless tobacco: Never Used  . Alcohol use Yes     Comment: occasional   . Drug use: No  . Sexual activity: Not on file   Other Topics Concern  . Not on file   Social History Narrative  . No narrative on file    Past Surgical Hx:  Past Surgical History:  Procedure Laterality Date  . BILATERAL SALPINGECTOMY Bilateral 05/27/2016   Procedure: BILATERAL SALPINGECTOMY;  Surgeon: Everitt Amber, MD;  Location: [facility];  Service: Gynecology;  Laterality: Bilateral;  . LYMPH NODE DISSECTION Bilateral 05/27/2016   Procedure: LYMPH NODE DISSECTION;  Surgeon: Everitt Amber, MD;  Location: [facility];  Service: Gynecology;  Laterality: Bilateral;  . ROBOTIC ASSISTED TOTAL HYSTERECTOMY N/A 05/27/2016   Procedure: XI ROBOTIC ASSISTED RADICAL  HYSTERECTOMY,BILATERAL OVARIAN TRANSPOSITION;  Surgeon: Everitt Amber, MD;  Location: [facility];  Service: Gynecology;  Laterality: N/A;    Past Medical Hx: History  reviewed. No pertinent past medical history.  Family Hx: History reviewed. No pertinent family history.  Vitals:  Blood pressure (!) 110/59, pulse (!) 108, temperature 98.8 F (37.1 C), temperature source Oral, resp. rate 18, last menstrual period 05/07/2016, SpO2 100 %.  Physical Exam:  General: Well developed, well  nourished female in no acute distress. Alert and oriented x 3.  Appearing lethargic.  Cardiovascular: Tachycardic at 123 bpm upon initial vital sign assessment. Extremities: No bilateral cyanosis, edema, or clubbing.  13F foley placed by Clayborne Artist, RN without difficulty.  1300 cc of cloudy, yellow urine in the bag after 5 min. Urine sample obtained.  Assessment/Plan: 36 year old s/p robotic-assisted type III radical laparoscopic hysterectomy with bilateral salpingoophorectomy and bilateral pelvic lymphadenectomy on 05/27/16 for endocervical adenocarcinoma now with chills, acute urinary retention, vomiting.  No Foley replaced per Dr. Denman George.  CBC and Cmet now prior to STAT CT AP at 1pm at [facility] to rule out pelvic fluid collection, bladder/ureteral injury.   Timber Marshman DEAL, NP 06/09/2016, 4:29 PM  Update at 12:55pm: Patient was alert, oriented, in no distress.  Escorted in a wheelchair by Jovita Gamma, RN to CT.  Advised patient and husband to come back over the office after her scan to discuss results/plan.  Vitals reassessed with HR down to 101, BP 110/59, temp 98.8, R 18.

## 2016-06-08 NOTE — Patient Instructions (Signed)
Plan to begin taking Cipro 500 mg twice daily for 14 days for kidney infection.  Plan to keep the foley in place for two weeks-three weeks.  Please call the office for any questions or concerns.    Pyelonephritis, Adult Introduction Pyelonephritis is a kidney infection. The kidneys are the organs that filter a person's blood and move waste out of the bloodstream and into the urine. Urine passes from the kidneys, through the ureters, and into the bladder. There are two main types of pyelonephritis:  Infections that come on quickly without any warning (acute pyelonephritis).  Infections that last for a long period of time (chronic pyelonephritis). In most cases, the infection clears up with treatment and does not cause further problems. More severe infections or chronic infections can sometimes spread to the bloodstream or lead to other problems with the kidneys. What are the causes? This condition is usually caused by:  Bacteria traveling from the bladder to the kidney through infected urine. The urine in the bladder can become infected with bacteria from:  Bladder infection (cystitis).  Inflammation of the prostate gland (prostatitis).  Sexual intercourse, in females.  Bacteria traveling from the bloodstream to the kidney. What increases the risk? This condition is more likely to develop in:  Pregnant women.  Older people.  People who have diabetes.  People who have kidney stones or bladder stones.  People who have other abnormalities of the kidney or ureter.  People who have a catheter placed in the bladder.  People who have cancer.  People who are sexually active.  Women who use spermicides.  People who have had a prior urinary tract infection. What are the signs or symptoms? Symptoms of this condition include:  Frequent urination.  Strong or persistent urge to urinate.  Burning or stinging when urinating.  Abdominal pain.  Back pain.  Pain in the side or  flank area.  Fever.  Chills.  Blood in the urine, or dark urine.  Nausea.  Vomiting. How is this diagnosed? This condition may be diagnosed based on:  Medical history and physical exam.  Urine tests.  Blood tests. You may also have imaging tests of the kidneys, such as an ultrasound or CT scan. How is this treated? Treatment for this condition may depend on the severity of the infection.  If the infection is mild and is found early, you may be treated with antibiotic medicines taken by mouth. You will need to drink fluids to remain hydrated.  If the infection is more severe, you may need to stay in the hospital and receive antibiotics given directly into a vein through an IV tube. You may also need to receive fluids through an IV tube if you are not able to remain hydrated. After your hospital stay, you may need to take oral antibiotics for a period of time. Other treatments may be required, depending on the cause of the infection. Follow these instructions at home: Medicines  Take over-the-counter and prescription medicines only as told by your health care provider.  If you were prescribed an antibiotic medicine, take it as told by your health care provider. Do not stop taking the antibiotic even if you start to feel better. General instructions  Drink enough fluid to keep your urine clear or pale yellow.  Avoid caffeine, tea, and carbonated beverages. They tend to irritate the bladder.  Urinate often. Avoid holding in urine for long periods of time.  Urinate before and after sex.  After a bowel movement, women  should cleanse from front to back. Use each tissue only once.  Keep all follow-up visits as told by your health care provider. This is important. Contact a health care provider if:  Your symptoms do not get better after 2 days of treatment.  Your symptoms get worse.  You have a fever. Get help right away if:  You are unable to take your antibiotics or  fluids.  You have shaking chills.  You vomit.  You have severe flank or back pain.  You have extreme weakness or fainting. This information is not intended to replace advice given to you by your health care provider. Make sure you discuss any questions you have with your health care provider. Document Released: 04/26/2005 Document Revised: 10/02/2015 Document Reviewed: 08/19/2014  2017 Elsevier

## 2016-06-08 NOTE — Telephone Encounter (Signed)
Returned call to patient.  Patient stating she feels miserable.  She has been throwing up with chills.  Passing flatus but no bowel movement.  Stating everything hurts.  Stating she cannot do the in and out cath.  She states she thinks she is placing it in the right area then nothing comes out.    Advised her that I would reach out to Dr. Denman George and call her back with her recommendations.  Called patient back and advised her to come into the office for foley placement, lab work, and a CT AP STAT with IV and PO contrast per Dr. Denman George to rule out pelvic fluid collection, bladder/ureteral injury.  Verbalizing understanding.

## 2016-06-09 ENCOUNTER — Telehealth: Payer: Self-pay | Admitting: Gynecologic Oncology

## 2016-06-09 DIAGNOSIS — Z9889 Other specified postprocedural states: Principal | ICD-10-CM

## 2016-06-09 DIAGNOSIS — R112 Nausea with vomiting, unspecified: Secondary | ICD-10-CM

## 2016-06-09 MED ORDER — PROMETHAZINE HCL 25 MG PO TABS: 12.5000 mg | ORAL_TABLET | Freq: Four times a day (QID) | ORAL | 0 refills | Status: DC | PRN

## 2016-06-09 NOTE — Telephone Encounter (Signed)
Called to check on patient's current status.  She states she is "still pretty nauseous" with two episodes of emesis last pm, last at 4am.  She had one episode of feeling shaky with chills but did not check her temp.  She was able to eat some toast this am.  Started her abxs last pm.  She reports flatus but no BM since using a suppository on Sunday evening.  Phenergan sent in per pt request.  Advised it may cause drowsiness and to use only when needed.  She is to call our office for any new symptoms, continued emesis, elevated temp, etc, concerns or questions.  No concerns voiced at the end of the call.

## 2016-06-11 ENCOUNTER — Telehealth: Payer: Self-pay | Admitting: Gynecologic Oncology

## 2016-06-11 LAB — URINE CULTURE

## 2016-06-11 NOTE — Telephone Encounter (Signed)
Patient states that she is doing much better.  Tolerating diet.  Loose stools while taking the Cipro.  No fevers or chills reported.  Advised to call for any needs or concerns.  Foley functioning without issues.

## 2016-06-17 NOTE — Progress Notes (Signed)
Consult Note: Gyn-Onc  Consult was requested by Dr. Marvel Plan for the evaluation of Tiffany Faulkner 36 y.o. female  CC:  Chief Complaint  Patient presents with  . Cervical Cancer    Assessment/Plan:  Ms. Ameila Filosa  is a 36 y.o.  year old with stage IB1 cervical cancer (adenocarcinoma), s/p radical hysterectomy, complicated by urinary retention and pyelonephritis.  She is recovered from right pyelonephritis.  Urinary retention: improved based on voiding trial today. However, I have concern for progressive postvoid residuals/incomplete emptying particularly as she continues to lack an urge to urinate. I counseled Elliette to intentially void every 2 hours while awake and immediately before and after bedtime. She should do this regardless of need to void. She should measure urine output in a hat. If she records <1500cc in a day, she should notify us as this is a sign that she is likely retaining urine. She will return to clinic in 3 days for a post-void residual straight cath to ensure that she is adequately emptying.  Cervical cancer: low risk features. No adjuvant therapy recommended according to NCCN guidelines. Recommend 3 monthly exams for 2 years, then 6 monthly exams until 5 years postop. Annual vaginal cytology with obligate high risk HPV testing to evaluate for secondary HPV related dysplasia.    HPI: Tiffany Faulkner is a 36 year old G1P1 who is seen in consultation at the request of Dr Paula Compton for adenocarcinoma of the cervix.  The patient was considering having a second child and went to see Dr Marvel Plan in November, 2017 for IUD removal. 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   The patient was seen for colposcopy with Dr Marvel Plan on 04/20/16 and a friable lesion was seen on the face of the cervix. It was biopsied at 4 o'cloc, 2 o'clock, 11 and 7 o'clock as well as ECC and endometrial biopsies all of which  confirmed adenocarcinoma.  She reports a prior pap smear in 2015 at the time of her prior delivery which was normal. 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  The patient is otherwise very healthy. She has a history of 1 prior preterm vaginal delivery but no abdominal surgeries.  She reports vaginal spotting but it has been present since 2015 when her IUD was placed.  She reports postcoital bleeding for approximately 3 years (no recent change).  Interval Hx:  On 05/27/16 she underwent robotic assisted radical hysterectomy, bilateral salpingectomy, bilateral pelvic lymphadenectomy. Pathology revealed a 2cm low grade adenocarcinoma with no LVSI and 7 of 65mm (inner half) cervical stromal invasion. Nodes were negative .She had low risk disease and therefore no adjuvant therapy was recommended in accordance with NCCN guidelines.  1 week postoperatively she returned to the office for foley removal and voiding trial which she failed and was instructed on how to self catheterize.  She returned to the clinic 2 weeks postop with symptoms of fever, malaise and general abominal pain. CT abdo/pelvis revealed a right pyelonephritis. Cultures taken were positive for serratia marscens which was sensitive to the ciprofloxacin antibiotic that she was prescribed.   Since that time she has felt well.   Current Meds:  Outpatient Encounter Prescriptions as of 06/18/2016  Medication Sig  . ciprofloxacin (CIPRO) 500 MG tablet Take 1 tablet (500 mg total) by mouth 2 (two) times daily.  Marland Kitchen ibuprofen (ADVIL,MOTRIN) 800 MG tablet  Take 1 tablet (800 mg total) by mouth every 8 (eight) hours as needed (mild pain).  Marland Kitchen oxyCODONE-acetaminophen (PERCOCET/ROXICET) 5-325 MG tablet Take 1-2 tablets by mouth every 4 (four) hours as needed (moderate to severe pain (when tolerating fluids)).  Marland Kitchen promethazine (PHENERGAN) 25 MG tablet Take 0.5-1 tablets (12.5-25 mg total) by mouth  every 6 (six) hours as needed for nausea or vomiting.  . senna (SENOKOT) 8.6 MG TABS tablet Take 1 tablet (8.6 mg total) by mouth at bedtime.   No facility-administered encounter medications on file as of 06/18/2016.     Allergy: No Known Allergies  Social Hx:   Social History   Social History  . Marital status: Married    Spouse name: N/A  . Number of children: N/A  . Years of education: N/A   Occupational History  . Not on file.   Social History Main Topics  . Smoking status: Never Smoker  . Smokeless tobacco: Never Used  . Alcohol use Yes     Comment: occasional   . Drug use: No  . Sexual activity: Not on file   Other Topics Concern  . Not on file   Social History Narrative  . No narrative on file    Past Surgical Hx:  Past Surgical History:  Procedure Laterality Date  . BILATERAL SALPINGECTOMY Bilateral 05/27/2016   Procedure: BILATERAL SALPINGECTOMY;  Surgeon: Everitt Amber, MD;  Location: [facility];  Service: Gynecology;  Laterality: Bilateral;  . LYMPH NODE DISSECTION Bilateral 05/27/2016   Procedure: LYMPH NODE DISSECTION;  Surgeon: Everitt Amber, MD;  Location: [facility];  Service: Gynecology;  Laterality: Bilateral;  . ROBOTIC ASSISTED TOTAL HYSTERECTOMY N/A 05/27/2016   Procedure: XI ROBOTIC ASSISTED RADICAL  HYSTERECTOMY,BILATERAL OVARIAN TRANSPOSITION;  Surgeon: Everitt Amber, MD;  Location: [facility];  Service: Gynecology;  Laterality: N/A;    Past Medical Hx: History reviewed. No pertinent past medical history.  Past Gynecological History:  SVD x 1 (preterm at 28 weeks) Patient's last menstrual period was 05/07/2016.  Family Hx: History reviewed. No pertinent family history.  Review of Systems:  Constitutional  Feels well,    ENT Normal appearing ears and nares bilaterally Skin/Breast  No rash, sores, jaundice, itching, dryness Cardiovascular  No chest pain, shortness of breath, or edema  Pulmonary  No cough or wheeze.  Gastro Intestinal  No nausea, vomitting,  or diarrhoea. No bright red blood per rectum, no abdominal pain, change in bowel movement, or constipation.  Genito Urinary: no foley Musculo Skeletal  No myalgia, arthralgia, joint swelling or pain  Neurologic  No weakness, numbness, change in gait,  Psychology  No depression, anxiety, insomnia.   Vitals:  Blood pressure 97/69, pulse 79, temperature 98.2 F (36.8 C), temperature source Oral, resp. rate 18, height 5' 3.5" (1.613 m), last menstrual period 05/07/2016, SpO2 100 %.  Physical Exam: WD in NAD Neck  Supple NROM, without any enlargements.  Lymph Node Survey No cervical supraclavicular or inguinal adenopathy Cardiovascular  Pulse normal rate, regularity and rhythm. S1 and S2 normal.  Lungs  Clear to auscultation bilateraly, without wheezes/crackles/rhonchi. Good air movement.  Skin  No rash/lesions/breakdown  Psychiatry  Alert and oriented to person, place, and time  Abdomen  Normoactive bowel sounds, abdomen soft, non-tender and thin without evidence of hernia. Incisions well healed.  Back No CVA tenderness Genito Urinary : normal external female genitalia, normal vagina. Vaginal cuff in tact and healing normally - no blood or discharge. No palpable masses. Rectal  deferred Extremities  No  bilateral cyanosis, clubbing or edema.   30 minutes of direct face to face counseling time was spent with the patient. This included discussion about prognosis, therapy recommendations and postoperative side effects and are beyond the scope of routine postoperative care.   Donaciano Eva, MD  06/18/2016, 5:17 PM

## 2016-06-18 ENCOUNTER — Ambulatory Visit: Payer: BLUE CROSS/BLUE SHIELD | Attending: Gynecologic Oncology | Admitting: Gynecologic Oncology

## 2016-06-18 ENCOUNTER — Encounter: Payer: Self-pay | Admitting: Gynecologic Oncology

## 2016-06-18 VITALS — BP 97/69 | HR 79 | Temp 98.2°F | Resp 18 | Ht 63.5 in

## 2016-06-18 DIAGNOSIS — Z90710 Acquired absence of both cervix and uterus: Secondary | ICD-10-CM | POA: Insufficient documentation

## 2016-06-18 DIAGNOSIS — C539 Malignant neoplasm of cervix uteri, unspecified: Secondary | ICD-10-CM

## 2016-06-18 DIAGNOSIS — R339 Retention of urine, unspecified: Secondary | ICD-10-CM | POA: Insufficient documentation

## 2016-06-18 DIAGNOSIS — R338 Other retention of urine: Secondary | ICD-10-CM

## 2016-06-18 DIAGNOSIS — N12 Tubulo-interstitial nephritis, not specified as acute or chronic: Secondary | ICD-10-CM | POA: Insufficient documentation

## 2016-06-18 NOTE — Patient Instructions (Signed)
Follow up with our office Monday 06/21/16 at 1:00pm

## 2016-06-21 ENCOUNTER — Ambulatory Visit: Payer: BLUE CROSS/BLUE SHIELD | Attending: Gynecologic Oncology | Admitting: Gynecologic Oncology

## 2016-06-21 ENCOUNTER — Encounter: Payer: Self-pay | Admitting: Gynecologic Oncology

## 2016-06-21 ENCOUNTER — Ambulatory Visit: Payer: BLUE CROSS/BLUE SHIELD | Admitting: Gynecologic Oncology

## 2016-06-21 VITALS — BP 117/75 | HR 75 | Temp 98.1°F | Resp 18 | Ht 63.5 in | Wt 127.4 lb

## 2016-06-21 DIAGNOSIS — C539 Malignant neoplasm of cervix uteri, unspecified: Secondary | ICD-10-CM | POA: Insufficient documentation

## 2016-06-21 DIAGNOSIS — R3915 Urgency of urination: Secondary | ICD-10-CM | POA: Diagnosis not present

## 2016-06-21 NOTE — Patient Instructions (Signed)
You no longer need to measure voided volumes.  You should call our office if you notice decreasing voiding abilities or decreased bladder sensation.  If doing well, we will see you again in 3 months for routine follow-up care.  You do not have to return on 06/23/16 for follow-up.

## 2016-06-21 NOTE — Progress Notes (Signed)
Seen today for voiding trial/post void residual.  Catheter out for 4 days.  Feeling more of an urge to void now.  Voided 600cc in hat today and post void residual (via cath) was <100cc.  No evidence of enduring significant urinary retention.  Patient no longer needs to measure voiding volumes.  Will contact us if she feels this is getting worse again. Otherwise, will return for routine follow-up in 3 months.  Donaciano Eva, MD

## 2016-06-23 ENCOUNTER — Ambulatory Visit: Payer: BLUE CROSS/BLUE SHIELD | Admitting: Gynecologic Oncology

## 2016-09-13 ENCOUNTER — Ambulatory Visit: Payer: BLUE CROSS/BLUE SHIELD | Attending: Gynecologic Oncology | Admitting: Gynecologic Oncology

## 2016-09-13 ENCOUNTER — Encounter: Payer: Self-pay | Admitting: Gynecologic Oncology

## 2016-09-13 VITALS — BP 114/70 | HR 78 | Temp 98.2°F | Resp 18 | Wt 130.2 lb

## 2016-09-13 DIAGNOSIS — R339 Retention of urine, unspecified: Secondary | ICD-10-CM

## 2016-09-13 DIAGNOSIS — Z79899 Other long term (current) drug therapy: Secondary | ICD-10-CM | POA: Insufficient documentation

## 2016-09-13 DIAGNOSIS — Z90710 Acquired absence of both cervix and uterus: Secondary | ICD-10-CM | POA: Diagnosis not present

## 2016-09-13 DIAGNOSIS — N930 Postcoital and contact bleeding: Secondary | ICD-10-CM | POA: Diagnosis not present

## 2016-09-13 DIAGNOSIS — N898 Other specified noninflammatory disorders of vagina: Secondary | ICD-10-CM

## 2016-09-13 DIAGNOSIS — Z9889 Other specified postprocedural states: Secondary | ICD-10-CM | POA: Diagnosis not present

## 2016-09-13 DIAGNOSIS — N12 Tubulo-interstitial nephritis, not specified as acute or chronic: Secondary | ICD-10-CM | POA: Insufficient documentation

## 2016-09-13 DIAGNOSIS — C539 Malignant neoplasm of cervix uteri, unspecified: Secondary | ICD-10-CM | POA: Diagnosis not present

## 2016-09-13 NOTE — Progress Notes (Signed)
Consult Note: Gyn-Onc  Consult was requested by Dr. Marvel Plan for the evaluation of Tiffany Faulkner 36 y.o. female  CC:  Chief Complaint  Patient presents with  . Cervical Cancer    Assessment/Plan:  Tiffany Faulkner  is a 36 y.o.  year old with stage IB1 cervical cancer (adenocarcinoma), s/p radical hysterectomy, complicated by urinary retention and pyelonephritis.  She is recovered from right pyelonephritis.  Urinary retention: persistent symptoms of inability to feel the urge to void but feels she is completely emptying. Offered referral to [facility]. Declined for now.   Cervical cancer: low risk features. No adjuvant therapy recommended according to NCCN guidelines. Recommend continue 3 monthly exams for 2 years, then 6 monthly exams until 5 years postop. Annual vaginal cytology with obligate high risk HPV testing to evaluate for secondary HPV related dysplasia.    HPI: Tiffany Faulkner is a 36 year old G1P1 who is seen in consultation at the request of Dr Paula Compton for adenocarcinoma of the cervix.  The patient was considering having a second child and went to see Dr Marvel Plan in November, 2017 for IUD removal. 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   The patient was seen for colposcopy with Dr Marvel Plan on 04/20/16 and a friable lesion was seen on the face of the cervix. It was biopsied at 4 o'cloc, 2 o'clock, 11 and 7 o'clock as well as ECC and endometrial biopsies all of which confirmed adenocarcinoma.  She reports a prior pap smear in 2015 at the time of her prior delivery which was normal. 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  The patient is otherwise very healthy. She has a history of 1 prior preterm vaginal delivery but no abdominal surgeries.  She reports vaginal spotting but it has been present since 2015 when her IUD was placed.  She  reports postcoital bleeding for approximately 3 years (no recent change).  On 05/27/16 she underwent robotic assisted radical hysterectomy, bilateral salpingectomy, bilateral pelvic lymphadenectomy. Pathology revealed a 2cm low grade adenocarcinoma with no LVSI and 7 of 29mm (inner half) cervical stromal invasion. Nodes were negative .She had low risk disease and therefore no adjuvant therapy was recommended in accordance with NCCN guidelines.  1 week postoperatively she returned to the office for foley removal and voiding trial which she failed and was instructed on how to self catheterize.  She returned to the clinic 2 weeks postop with symptoms of fever, malaise and general abominal pain. CT abdo/pelvis revealed a right pyelonephritis. Cultures taken were positive for serratia marscens which was sensitive to the ciprofloxacin antibiotic that she was prescribed.   Interval Hx:  Since that time she has felt well. No bleeding.   Current Meds:  Outpatient Encounter Prescriptions as of 09/13/2016  Medication Sig  . [DISCONTINUED] ciprofloxacin (CIPRO) 500 MG tablet Take 1 tablet (500 mg total) by mouth 2 (two) times daily.  . [DISCONTINUED] ibuprofen (ADVIL,MOTRIN) 800 MG tablet Take 1 tablet (800 mg total) by mouth every 8 (eight) hours as needed (mild pain).  . [DISCONTINUED] oxyCODONE-acetaminophen (PERCOCET/ROXICET) 5-325 MG tablet Take 1-2 tablets by mouth every 4 (four) hours as needed (moderate to severe pain (when tolerating fluids)).  . [DISCONTINUED] promethazine (PHENERGAN) 25 MG tablet Take 0.5-1 tablets (12.5-25 mg total) by mouth every 6 (six) hours as needed for nausea or vomiting.  . [DISCONTINUED] senna (  SENOKOT) 8.6 MG TABS tablet Take 1 tablet (8.6 mg total) by mouth at bedtime.   No facility-administered encounter medications on file as of 09/13/2016.     Allergy: No Known Allergies  Social Hx:   Social History   Social History  . Marital status: Married    Spouse name:  N/A  . Number of children: N/A  . Years of education: N/A   Occupational History  . Not on file.   Social History Main Topics  . Smoking status: Never Smoker  . Smokeless tobacco: Never Used  . Alcohol use Yes     Comment: occasional   . Drug use: No  . Sexual activity: Not on file   Other Topics Concern  . Not on file   Social History Narrative  . No narrative on file    Past Surgical Hx:  Past Surgical History:  Procedure Laterality Date  . BILATERAL SALPINGECTOMY Bilateral 05/27/2016   Procedure: BILATERAL SALPINGECTOMY;  Surgeon: Everitt Amber, MD;  Location: [facility];  Service: Gynecology;  Laterality: Bilateral;  . LYMPH NODE DISSECTION Bilateral 05/27/2016   Procedure: LYMPH NODE DISSECTION;  Surgeon: Everitt Amber, MD;  Location: [facility];  Service: Gynecology;  Laterality: Bilateral;  . ROBOTIC ASSISTED TOTAL HYSTERECTOMY N/A 05/27/2016   Procedure: XI ROBOTIC ASSISTED RADICAL  HYSTERECTOMY,BILATERAL OVARIAN TRANSPOSITION;  Surgeon: Everitt Amber, MD;  Location: [facility];  Service: Gynecology;  Laterality: N/A;    Past Medical Hx: History reviewed. No pertinent past medical history.  Past Gynecological History:  SVD x 1 (preterm at 28 weeks) Patient's last menstrual period was 05/07/2016.  Family Hx: History reviewed. No pertinent family history.  Review of Systems:  Constitutional  Feels well,    ENT Normal appearing ears and nares bilaterally Skin/Breast  No rash, sores, jaundice, itching, dryness Cardiovascular  No chest pain, shortness of breath, or edema  Pulmonary  No cough or wheeze.  Gastro Intestinal  No nausea, vomitting, or diarrhoea. No bright red blood per rectum, no abdominal pain, change in bowel movement, or constipation.  Genito Urinary: no foley Musculo Skeletal  No myalgia, arthralgia, joint swelling or pain  Neurologic  No weakness, numbness, change in gait,  Psychology  No depression, anxiety, insomnia.   Vitals:  Blood pressure 114/70, pulse  78, temperature 98.2 F (36.8 C), temperature source Oral, resp. rate 18, weight 130 lb 3.2 oz (59.1 kg), last menstrual period 05/07/2016.  Physical Exam: WD in NAD Neck  Supple NROM, without any enlargements.  Lymph Node Survey No cervical supraclavicular or inguinal adenopathy Cardiovascular  Pulse normal rate, regularity and rhythm. S1 and S2 normal.  Lungs  Clear to auscultation bilateraly, without wheezes/crackles/rhonchi. Good air movement.  Skin  No rash/lesions/breakdown  Psychiatry  Alert and oriented to person, place, and time  Abdomen  Normoactive bowel sounds, abdomen soft, non-tender and thin without evidence of hernia. Incisions well healed.  Back No CVA tenderness Genito Urinary : normal external female genitalia, normal vagina. Vaginal cuff with 67mm frond like granulation tissue at midpoint. Rectal  deferred Extremities  No bilateral cyanosis, clubbing or edema.   PROCEDURE NOTE Verbal consent obtained Time out performed tischler forceps used to grasp and and remove tissue. Hemostasis obtained with silver nitrate. EBL: minimal Specimen: vaginal biopsy - surgical pathology.  Donaciano Eva, MD  09/13/2016, 2:54 PM

## 2016-09-13 NOTE — Patient Instructions (Signed)
We will call you with the biopsy results when available.  You will follow up with Dr. Denman George in August 2018.

## 2016-09-14 ENCOUNTER — Telehealth: Payer: Self-pay

## 2016-09-14 NOTE — Telephone Encounter (Signed)
Told Ms Correira that the vaginal biopsy was negative for cancer per Joylene John, NP.

## 2017-01-03 ENCOUNTER — Encounter: Payer: Self-pay | Admitting: Gynecologic Oncology

## 2017-01-03 ENCOUNTER — Ambulatory Visit: Payer: BLUE CROSS/BLUE SHIELD | Attending: Gynecologic Oncology | Admitting: Gynecologic Oncology

## 2017-01-03 VITALS — BP 109/67 | HR 88 | Temp 98.6°F | Resp 18 | Wt 130.5 lb

## 2017-01-03 DIAGNOSIS — R339 Retention of urine, unspecified: Secondary | ICD-10-CM | POA: Diagnosis not present

## 2017-01-03 DIAGNOSIS — Z87440 Personal history of urinary (tract) infections: Secondary | ICD-10-CM | POA: Insufficient documentation

## 2017-01-03 DIAGNOSIS — C539 Malignant neoplasm of cervix uteri, unspecified: Secondary | ICD-10-CM | POA: Insufficient documentation

## 2017-01-03 DIAGNOSIS — Z90710 Acquired absence of both cervix and uterus: Secondary | ICD-10-CM | POA: Diagnosis not present

## 2017-01-03 NOTE — Patient Instructions (Signed)
Please see Dr Marvel Plan for follow-up in 3 months and return to see Dr Denman George in January, 2019.  Please call Dr Serita Grit office at [phone] for this appointment in the fall of 2018.  Please notify Dr Denman George at phone number [phone] if you notice vaginal bleeding, new pelvic or abdominal pains, bloating, feeling full easy, or a change in bladder or bowel function.

## 2017-01-03 NOTE — Progress Notes (Signed)
Consult Note: Gyn-Onc  Consult was requested by Dr. Marvel Plan for the evaluation of Tiffany Faulkner 36 y.o. female  CC:  Chief Complaint  Patient presents with  . Cervical cancer, FIGO stage IB1 (HCC)    Assessment/Plan:  [name]  is a 36 y.o.  year old with stage IB1 cervical cancer (adenocarcinoma), s/p radical hysterectomy, complicated by urinary retention and pyelonephritis.  She is recovered from right pyelonephritis.  Urinary retention: persistent symptoms of inability to feel the urge to void but feels she is completely emptying.   Cervical cancer: low risk features. No adjuvant therapy recommended according to NCCN guidelines. Recommend continue 3 monthly exams for 2 years, then 6 monthly exams until 5 years postop. Annual vaginal cytology with obligate high risk HPV testing to evaluate for secondary HPV related dysplasia.    HPI: Tiffany Faulkner is a 36 year old G1P1 who is seen in consultation at the request of Dr Paula Compton for adenocarcinoma of the cervix.  The patient was considering having a second child and went to see Dr Marvel Plan in November, 2017 for IUD removal. 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   The patient was seen for colposcopy with Dr Marvel Plan on 04/20/16 and a friable lesion was seen on the face of the cervix. It was biopsied at 4 o'cloc, 2 o'clock, 11 and 7 o'clock as well as ECC and endometrial biopsies all of which confirmed adenocarcinoma.  She reports a prior pap smear in 2015 at the time of her prior delivery which was normal. 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  The patient is otherwise very healthy. She has a history of 1 prior preterm vaginal delivery but no abdominal surgeries.  She reports vaginal spotting but it has been present since 2015 when her IUD was placed.  She reports postcoital bleeding for  approximately 3 years (no recent change).  On 05/27/16 she underwent robotic assisted radical hysterectomy, bilateral salpingectomy, bilateral pelvic lymphadenectomy. Pathology revealed a 2cm low grade adenocarcinoma with no LVSI and 7 of 79mm (inner half) cervical stromal invasion. Nodes were negative .She had low risk disease and therefore no adjuvant therapy was recommended in accordance with NCCN guidelines.  1 week postoperatively she returned to the office for foley removal and voiding trial which she failed and was instructed on how to self catheterize.  She returned to the clinic 2 weeks postop with symptoms of fever, malaise and general abominal pain. CT abdo/pelvis revealed a right pyelonephritis. Cultures taken were positive for serratia marscens which was sensitive to the ciprofloxacin antibiotic that she was prescribed.   Interval Hx:  Since that time she has felt well. No bleeding.   Current Meds:  Outpatient Encounter Prescriptions as of 01/03/2017  Medication Sig  . senna-docusate (SENOKOT S) 8.6-50 MG tablet Take 2-3 tablets by mouth at bedtime.   No facility-administered encounter medications on file as of 01/03/2017.     Allergy: No Known Allergies  Social Hx:   Social History   Social History  . Marital status: Married    Spouse name: N/A  . Number of children: N/A  . Years of education: N/A   Occupational History  . Not on file.   Social History Main Topics  . Smoking status: Never Smoker  . Smokeless tobacco: Never Used  . Alcohol use Yes  Comment: occasional   . Drug use: No  . Sexual activity: Not on file   Other Topics Concern  . Not on file   Social History Narrative  . No narrative on file    Past Surgical Hx:  Past Surgical History:  Procedure Laterality Date  . BILATERAL SALPINGECTOMY Bilateral 05/27/2016   Procedure: BILATERAL SALPINGECTOMY;  Surgeon: Everitt Amber, MD;  Location: [facility];  Service: Gynecology;  Laterality: Bilateral;  .  LYMPH NODE DISSECTION Bilateral 05/27/2016   Procedure: LYMPH NODE DISSECTION;  Surgeon: Everitt Amber, MD;  Location: [facility];  Service: Gynecology;  Laterality: Bilateral;  . ROBOTIC ASSISTED TOTAL HYSTERECTOMY N/A 05/27/2016   Procedure: XI ROBOTIC ASSISTED RADICAL  HYSTERECTOMY,BILATERAL OVARIAN TRANSPOSITION;  Surgeon: Everitt Amber, MD;  Location: [facility];  Service: Gynecology;  Laterality: N/A;    Past Medical Hx: History reviewed. No pertinent past medical history.  Past Gynecological History:  SVD x 1 (preterm at 28 weeks) Patient's last menstrual period was 05/07/2016.  Family Hx: History reviewed. No pertinent family history.  Review of Systems:  Constitutional  Feels well,    ENT Normal appearing ears and nares bilaterally Skin/Breast  No rash, sores, jaundice, itching, dryness Cardiovascular  No chest pain, shortness of breath, or edema  Pulmonary  No cough or wheeze.  Gastro Intestinal  No nausea, vomitting, or diarrhoea. No bright red blood per rectum, no abdominal pain, change in bowel movement, or constipation.  Genito Urinary: no foley Musculo Skeletal  No myalgia, arthralgia, joint swelling or pain  Neurologic  No weakness, numbness, change in gait,  Psychology  No depression, anxiety, insomnia.   Vitals:  Blood pressure 109/67, pulse 88, temperature 98.6 F (37 C), temperature source Oral, resp. rate 18, weight 130 lb 8 oz (59.2 kg), last menstrual period 05/07/2016, SpO2 100 %.  Physical Exam: WD in NAD Neck  Supple NROM, without any enlargements.  Lymph Node Survey No cervical supraclavicular or inguinal adenopathy Cardiovascular  Pulse normal rate, regularity and rhythm. S1 and S2 normal.  Lungs  Clear to auscultation bilateraly, without wheezes/crackles/rhonchi. Good air movement.  Skin  No rash/lesions/breakdown  Psychiatry  Alert and oriented to person, place, and time  Abdomen  Normoactive bowel sounds, abdomen soft, non-tender and thin without  evidence of hernia. Incisions well healed.  Back No CVA tenderness Genito Urinary : normal external female genitalia, normal vagina. Vaginal cuff without lesions or masses. Rectal  deferred Extremities  No bilateral cyanosis, clubbing or edema.  Donaciano Eva, MD  01/03/2017, 4:05 PM

## 2017-05-11 ENCOUNTER — Telehealth: Payer: Self-pay | Admitting: *Deleted

## 2017-05-11 NOTE — Telephone Encounter (Signed)
Patient called and scheduled a follow up appt for March  

## 2017-07-15 ENCOUNTER — Encounter: Payer: Self-pay | Admitting: Gynecologic Oncology

## 2017-07-15 ENCOUNTER — Inpatient Hospital Stay: Payer: BLUE CROSS/BLUE SHIELD | Attending: Gynecologic Oncology | Admitting: Gynecologic Oncology

## 2017-07-15 VITALS — BP 124/70 | HR 57 | Temp 97.9°F | Resp 20 | Ht 63.5 in | Wt 132.2 lb

## 2017-07-15 DIAGNOSIS — C539 Malignant neoplasm of cervix uteri, unspecified: Secondary | ICD-10-CM | POA: Insufficient documentation

## 2017-07-15 DIAGNOSIS — Z79899 Other long term (current) drug therapy: Secondary | ICD-10-CM | POA: Diagnosis not present

## 2017-07-15 DIAGNOSIS — Z90710 Acquired absence of both cervix and uterus: Secondary | ICD-10-CM | POA: Diagnosis not present

## 2017-07-15 DIAGNOSIS — Z90722 Acquired absence of ovaries, bilateral: Secondary | ICD-10-CM | POA: Insufficient documentation

## 2017-07-15 NOTE — Patient Instructions (Signed)
Please notify Dr Denman George at phone number [phone] if you notice vaginal bleeding, new pelvic or abdominal pains, bloating, feeling full easy, or a change in bladder or bowel function.   Please return to see Dr Denman George as scheduled in June, 2019.

## 2017-07-15 NOTE — Progress Notes (Signed)
Follow-up Note: Gyn-Onc  Consult was requested by Dr. Marvel Plan for the evaluation of Tiffany Faulkner 37 y.o. female  CC:  Chief Complaint  Patient presents with  . Cervical cancer, FIGO stage IB1 (HCC)    Assessment/Plan:  [name]  is a 37 y.o.  year old with stage IB1 cervical cancer (adenocarcinoma), s/p radical hysterectomy, complicated by urinary retention and pyelonephritis.   No evidence of disease recurrence on exam.   Urinary retention: resolved.  Cervical cancer: low risk features. No adjuvant therapy recommended according to NCCN guidelines. Recommend continue 3 monthly exams for 2 years, then 6 monthly exams until 5 years postop. Annual vaginal cytology in December, with obligate high risk HPV testing to evaluate for secondary HPV related dysplasia.    HPI: Tiffany Faulkner is a 37 year old G1P1 who is seen in consultation at the request of Dr Paula Compton for adenocarcinoma of the cervix.  The patient was considering having a second child and went to see Dr Marvel Plan in November, 2017 for IUD removal. 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   The patient was seen for colposcopy with Dr Marvel Plan on 04/20/16 and a friable lesion was seen on the face of the cervix. It was biopsied at 4 o'cloc, 2 o'clock, 11 and 7 o'clock as well as ECC and endometrial biopsies all of which confirmed adenocarcinoma.  She reports a prior pap smear in 2015 at the time of her prior delivery which was normal. 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  The patient is otherwise very healthy. She has a history of 1 prior preterm vaginal delivery but no abdominal surgeries.  She reports vaginal spotting but it has been present since 2015 when her IUD was placed.  She reports postcoital bleeding for approximately 3 years (no recent change).  On 05/27/16 she underwent  robotic assisted radical hysterectomy, bilateral salpingectomy, bilateral pelvic lymphadenectomy. Pathology revealed a 2cm low grade adenocarcinoma with no LVSI and 7 of 82mm (inner half) cervical stromal invasion. Nodes were negative .She had low risk disease and therefore no adjuvant therapy was recommended in accordance with NCCN guidelines.  1 week postoperatively she returned to the office for foley removal and voiding trial which she failed and was instructed on how to self catheterize.  Interval Hx:  Since that time she has felt well. No bleeding. No symptoms concerning for recurrence. She seems to have resolved the issues of urinary retention. Pap with Dr Nena Alexander office in December, 2018   Current Meds:  Outpatient Encounter Medications as of 07/15/2017  Medication Sig  . cholecalciferol (VITAMIN D) 1000 units tablet Take 1,000 Units by mouth daily.  Marland Kitchen senna-docusate (SENOKOT S) 8.6-50 MG tablet Take 2-3 tablets by mouth at bedtime.   No facility-administered encounter medications on file as of 07/15/2017.     Allergy: No Known Allergies  Social Hx:   Social History   Socioeconomic History  . Marital status: Married    Spouse name: Not on file  . Number of children: Not on file  . Years of education: Not on file  . Highest education level: Not on file  Social Needs  . Financial resource strain: Not on file  . Food insecurity - worry: Not on file  . Food insecurity - inability: Not on file  . Transportation needs - medical: Not on file  .  Transportation needs - non-medical: Not on file  Occupational History  . Not on file  Tobacco Use  . Smoking status: Never Smoker  . Smokeless tobacco: Never Used  Substance and Sexual Activity  . Alcohol use: Yes    Comment: occasional   . Drug use: No  . Sexual activity: Not on file  Other Topics Concern  . Not on file  Social History Narrative  . Not on file    Past Surgical Hx:  Past Surgical History:  Procedure  Laterality Date  . BILATERAL SALPINGECTOMY Bilateral 05/27/2016   Procedure: BILATERAL SALPINGECTOMY;  Surgeon: Everitt Amber, MD;  Location: [facility];  Service: Gynecology;  Laterality: Bilateral;  . LYMPH NODE DISSECTION Bilateral 05/27/2016   Procedure: LYMPH NODE DISSECTION;  Surgeon: Everitt Amber, MD;  Location: [facility];  Service: Gynecology;  Laterality: Bilateral;  . ROBOTIC ASSISTED TOTAL HYSTERECTOMY N/A 05/27/2016   Procedure: XI ROBOTIC ASSISTED RADICAL  HYSTERECTOMY,BILATERAL OVARIAN TRANSPOSITION;  Surgeon: Everitt Amber, MD;  Location: [facility];  Service: Gynecology;  Laterality: N/A;    Past Medical Hx: History reviewed. No pertinent past medical history.  Past Gynecological History:  SVD x 1 (preterm at 28 weeks) Patient's last menstrual period was 05/07/2016.  Family Hx: History reviewed. No pertinent family history.  Review of Systems:  Constitutional  Feels well,    ENT Normal appearing ears and nares bilaterally Skin/Breast  No rash, sores, jaundice, itching, dryness Cardiovascular  No chest pain, shortness of breath, or edema  Pulmonary  No cough or wheeze.  Gastro Intestinal  No nausea, vomitting, or diarrhoea. No bright red blood per rectum, no abdominal pain, change in bowel movement, or constipation.  Genito Urinary: no foley Musculo Skeletal  No myalgia, arthralgia, joint swelling or pain  Neurologic  No weakness, numbness, change in gait,  Psychology  No depression, anxiety, insomnia.   Vitals:  Blood pressure 124/70, pulse (!) 57, temperature 97.9 F (36.6 C), temperature source Oral, resp. rate 20, height 5' 3.5" (1.613 m), weight 132 lb 3.2 oz (60 kg), last menstrual period 05/07/2016, SpO2 100 %.  Physical Exam: WD in NAD Neck  Supple NROM, without any enlargements.  Lymph Node Survey No cervical supraclavicular or inguinal adenopathy Cardiovascular  Pulse normal rate, regularity and rhythm. S1 and S2 normal.  Lungs  Clear to auscultation bilateraly,  without wheezes/crackles/rhonchi. Good air movement.  Skin  No rash/lesions/breakdown  Psychiatry  Alert and oriented to person, place, and time  Abdomen  Normoactive bowel sounds, abdomen soft, non-tender and thin without evidence of hernia. Incisions well healed.  Back No CVA tenderness Genito Urinary : normal external female genitalia, normal vagina. Vaginal cuff without lesions or masses. Rectal  deferred Extremities  No bilateral cyanosis, clubbing or edema.  Thereasa Solo, MD  07/15/2017, 2:19 PM

## 2017-10-19 ENCOUNTER — Inpatient Hospital Stay: Payer: BLUE CROSS/BLUE SHIELD | Attending: Gynecologic Oncology | Admitting: Gynecologic Oncology

## 2017-10-19 ENCOUNTER — Encounter: Payer: Self-pay | Admitting: Gynecologic Oncology

## 2017-10-19 VITALS — BP 115/68 | HR 64 | Temp 98.1°F | Resp 18 | Ht 63.5 in | Wt 133.5 lb

## 2017-10-19 DIAGNOSIS — C539 Malignant neoplasm of cervix uteri, unspecified: Secondary | ICD-10-CM | POA: Insufficient documentation

## 2017-10-19 DIAGNOSIS — Z90710 Acquired absence of both cervix and uterus: Secondary | ICD-10-CM | POA: Insufficient documentation

## 2017-10-19 DIAGNOSIS — Z90722 Acquired absence of ovaries, bilateral: Secondary | ICD-10-CM | POA: Insufficient documentation

## 2017-10-19 NOTE — Progress Notes (Signed)
Follow-up Note: Gyn-Onc  Consult was requested by Dr. Marvel Plan for the evaluation of Tiffany Faulkner 37 y.o. female  CC:  Chief Complaint  Patient presents with  . Cervical cancer, FIGO stage IB1 (HCC)    Assessment/Plan:  Tiffany Faulkner  is a 37 y.o.  year old with stage IB1 cervical cancer (adenocarcinoma), s/p radical hysterectomy, complicated by urinary retention and pyelonephritis.   No evidence of disease recurrence on exam.   Urinary retention: resolved.  Cervical cancer: low risk features. No adjuvant therapy recommended according to NCCN guidelines. Recommend continue 3 monthly exams for 2 years, then 6 monthly exams until 5 years postop. Annual vaginal cytology in December, with obligate high risk HPV testing to evaluate for secondary HPV related dysplasia.    HPI: Tiffany Faulkner is a 37 year old G1P1 who is seen in consultation at the request of Dr Paula Compton for adenocarcinoma of the cervix.  The patient was considering having a second child and went to see Dr Marvel Plan in November, 2017 for IUD removal. 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   The patient was seen for colposcopy with Dr Marvel Plan on 04/20/16 and a friable lesion was seen on the face of the cervix. It was biopsied at 4 o'cloc, 2 o'clock, 11 and 7 o'clock as well as ECC and endometrial biopsies all of which confirmed adenocarcinoma.  She reports a prior pap smear in 2015 at the time of her prior delivery which was normal. 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  The patient is otherwise very healthy. She has a history of 1 prior preterm vaginal delivery but no abdominal surgeries.  She reports vaginal spotting but it has been present since 2015 when her IUD was placed.  She reports postcoital bleeding for approximately 3 years (no recent change).  On 05/27/16 she underwent  robotic assisted radical hysterectomy, bilateral salpingectomy, bilateral pelvic lymphadenectomy. Pathology revealed a 2cm low grade adenocarcinoma with no LVSI and 7 of 72mm (inner half) cervical stromal invasion. Nodes were negative .She had low risk disease and therefore no adjuvant therapy was recommended in accordance with NCCN guidelines.  1 week postoperatively she returned to the office for foley removal and voiding trial which she failed and was instructed on how to self catheterize.  Interval Hx:  Since that time she has felt well. No bleeding. No symptoms concerning for recurrence. She seems to have resolved the issues of urinary retention. Pap with Dr Nena Alexander office in December, 2018   Current Meds:  Outpatient Encounter Medications as of 10/19/2017  Medication Sig  . cholecalciferol (VITAMIN D) 1000 units tablet Take 1,000 Units by mouth daily.  Marland Kitchen senna-docusate (SENOKOT S) 8.6-50 MG tablet Take 2-3 tablets by mouth at bedtime.   No facility-administered encounter medications on file as of 10/19/2017.     Allergy: No Known Allergies  Social Hx:   Social History   Socioeconomic History  . Marital status: Married    Spouse name: Not on file  . Number of children: Not on file  . Years of education: Not on file  . Highest education level: Not on file  Occupational History  . Not on file  Social Needs  . Financial resource strain: Not on file  . Food insecurity:    Worry: Not on file    Inability: Not on file  .  Transportation needs:    Medical: Not on file    Non-medical: Not on file  Tobacco Use  . Smoking status: Never Smoker  . Smokeless tobacco: Never Used  Substance and Sexual Activity  . Alcohol use: Yes    Comment: occasional   . Drug use: No  . Sexual activity: Not on file  Lifestyle  . Physical activity:    Days per week: Not on file    Minutes per session: Not on file  . Stress: Not on file  Relationships  . Social connections:    Talks on  phone: Not on file    Gets together: Not on file    Attends religious service: Not on file    Active member of club or organization: Not on file    Attends meetings of clubs or organizations: Not on file    Relationship status: Not on file  . Intimate partner violence:    Fear of current or ex partner: Not on file    Emotionally abused: Not on file    Physically abused: Not on file    Forced sexual activity: Not on file  Other Topics Concern  . Not on file  Social History Narrative  . Not on file    Past Surgical Hx:  Past Surgical History:  Procedure Laterality Date  . BILATERAL SALPINGECTOMY Bilateral 05/27/2016   Procedure: BILATERAL SALPINGECTOMY;  Surgeon: Everitt Amber, MD;  Location: [facility];  Service: Gynecology;  Laterality: Bilateral;  . LYMPH NODE DISSECTION Bilateral 05/27/2016   Procedure: LYMPH NODE DISSECTION;  Surgeon: Everitt Amber, MD;  Location: [facility];  Service: Gynecology;  Laterality: Bilateral;  . ROBOTIC ASSISTED TOTAL HYSTERECTOMY N/A 05/27/2016   Procedure: XI ROBOTIC ASSISTED RADICAL  HYSTERECTOMY,BILATERAL OVARIAN TRANSPOSITION;  Surgeon: Everitt Amber, MD;  Location: [facility];  Service: Gynecology;  Laterality: N/A;    Past Medical Hx: History reviewed. No pertinent past medical history.  Past Gynecological History:  SVD x 1 (preterm at 28 weeks) Patient's last menstrual period was 05/07/2016.  Family Hx: History reviewed. No pertinent family history.  Review of Systems:  Constitutional  Feels well,    ENT Normal appearing ears and nares bilaterally Skin/Breast  No rash, sores, jaundice, itching, dryness Cardiovascular  No chest pain, shortness of breath, or edema  Pulmonary  No cough or wheeze.  Gastro Intestinal  No nausea, vomitting, or diarrhoea. No bright red blood per rectum, no abdominal pain, change in bowel movement, or constipation.  Genito Urinary: no foley Musculo Skeletal  No myalgia, arthralgia, joint swelling or pain  Neurologic  No  weakness, numbness, change in gait,  Psychology  No depression, anxiety, insomnia.   Vitals:  Blood pressure 115/68, pulse 64, temperature 98.1 F (36.7 C), temperature source Oral, resp. rate 18, height 5' 3.5" (1.613 m), weight 133 lb 8 oz (60.6 kg), last menstrual period 05/07/2016, SpO2 100 %.  Physical Exam: WD in NAD Neck  Supple NROM, without any enlargements.  Lymph Node Survey No cervical supraclavicular or inguinal adenopathy Cardiovascular  Pulse normal rate, regularity and rhythm. S1 and S2 normal.  Lungs  Clear to auscultation bilateraly, without wheezes/crackles/rhonchi. Good air movement.  Skin  No rash/lesions/breakdown  Psychiatry  Alert and oriented to person, place, and time  Abdomen  Normoactive bowel sounds, abdomen soft, non-tender and thin without evidence of hernia. Incisions well healed.  Back No CVA tenderness Genito Urinary : normal external female genitalia, normal vagina. Vaginal cuff without lesions or masses. Rectal  deferred Extremities  No bilateral cyanosis, clubbing or edema.  Thereasa Solo, MD  10/19/2017, 2:38 PM

## 2017-10-19 NOTE — Patient Instructions (Signed)
Please notify Dr Denman George at phone number [phone] if you notice vaginal bleeding, new pelvic or abdominal pains, bloating, feeling full easy, or a change in bladder or bowel function.   Please return to see Dr Denman George in September as scheduled, and Dr Marvel Plan in December for a pap smear.

## 2017-12-16 IMAGING — PT NM PET TUM IMG INITIAL (PI) SKULL BASE T - THIGH
7 series · 25 of 25 positions shown · non-contrast
Comparison: None.

CLINICAL DATA: Initial treatment strategy for cervical
adenocarcinoma.

EXAM:
NUCLEAR MEDICINE PET SKULL BASE TO THIGH
TECHNIQUE: 7.6 mCi F-18 FDG was injected intravenously. Full-ring PET imaging
was performed from the skull base to thigh after the radiotracer. CT
data was obtained and used for attenuation correction and anatomic
localization.
FASTING BLOOD GLUCOSE:  Value: 84 mg/dl

[Series 3: pet sk_thigh ac · axial · 5.0mm · 4.07mm/px · z∈[-1166,-310]mm · 6 of 215 slices shown]
[im 1/215]
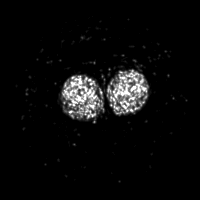
[im 43/215]
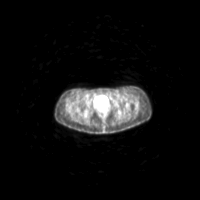
[im 86/215]
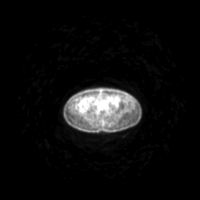
[im 129/215]
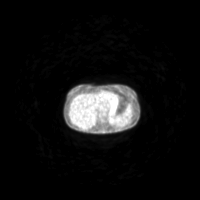
[im 172/215]
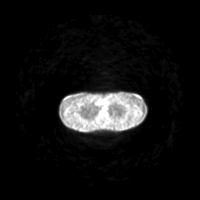
[im 215/215]
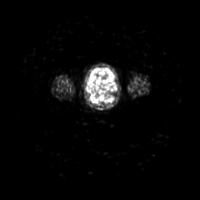

[Series 4: ct sk_thigh 5.0 b31f · axial · 5.0mm · 0.83mm/px · z∈[-1166,-310]mm · 5 of 215 slices shown]
[im 1/215]
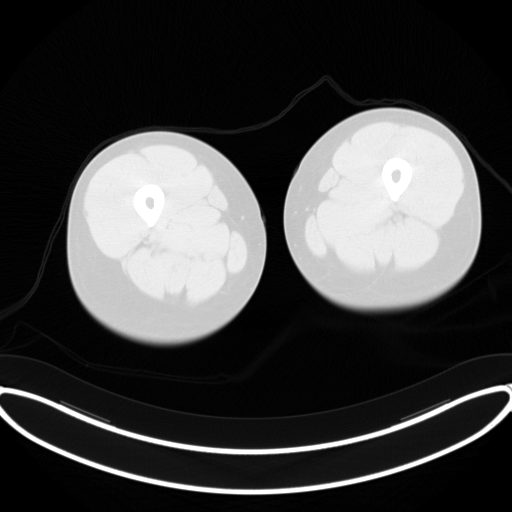
[im 54/215]
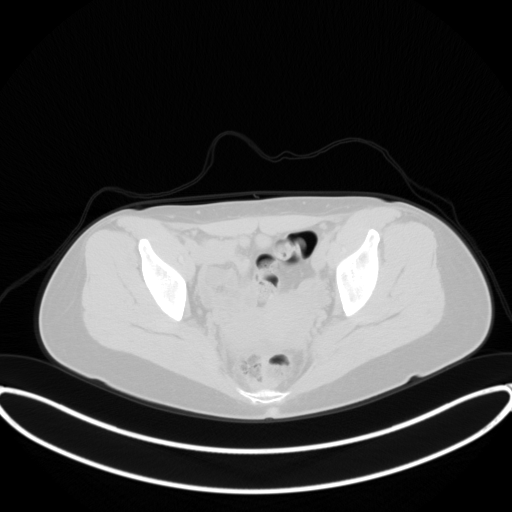
[im 108/215]
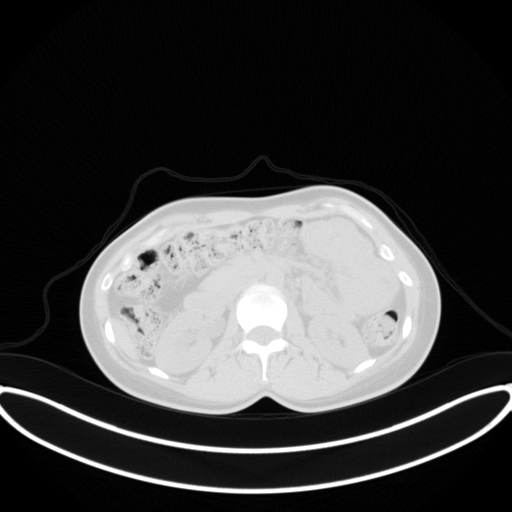
[im 161/215]
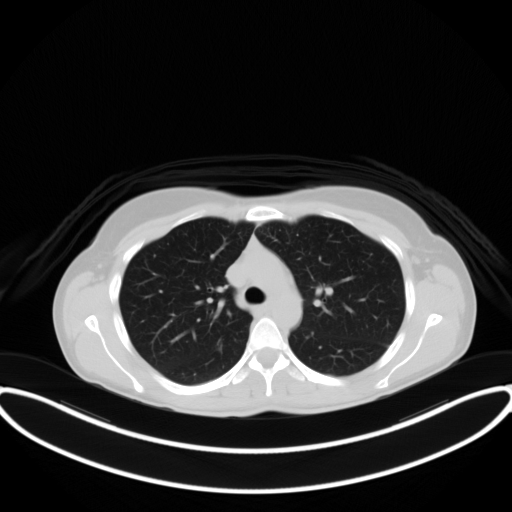
[im 215/215  brain]
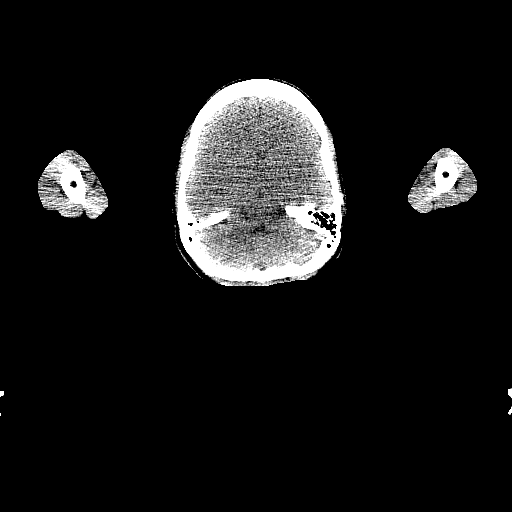

[Series 7: pet sk_thigh nac · axial · 5.0mm · 4.07mm/px · z∈[-1166,-310]mm · 5 of 215 slices shown]
[im 1/215]
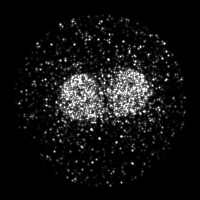
[im 54/215]
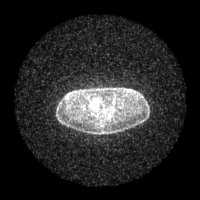
[im 108/215]
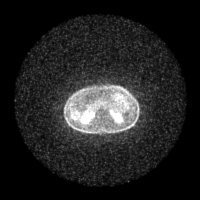
[im 161/215]
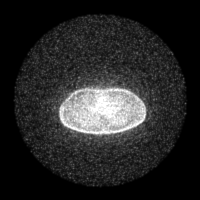
[im 215/215]
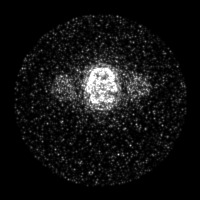

[Series 8: ct sk_thigh 5.0 b70f lung_bone · axial · 5.0mm · 0.59mm/px · 1 of 57 slices shown]
[im 1/57  bone]
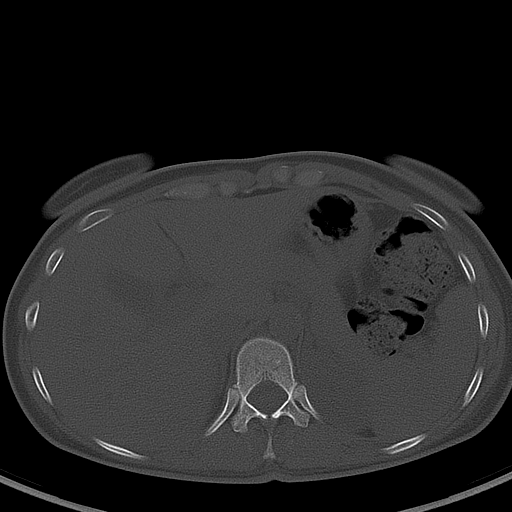

[Series 604: range-ct sk_thigh 5.0 (id)<alpha range> · 2 of 65 slices shown (1 of 2)]
[im 1/65]
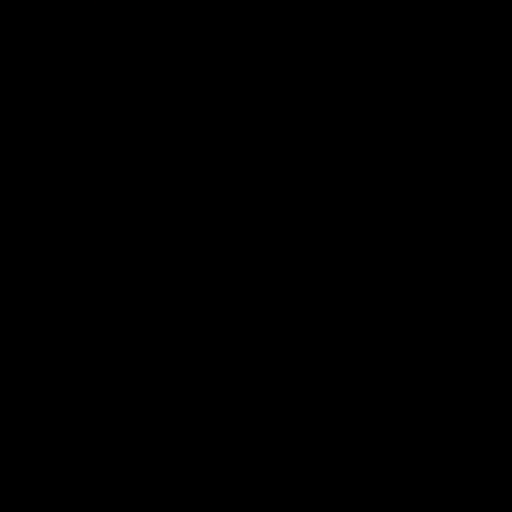
[im 65/65]
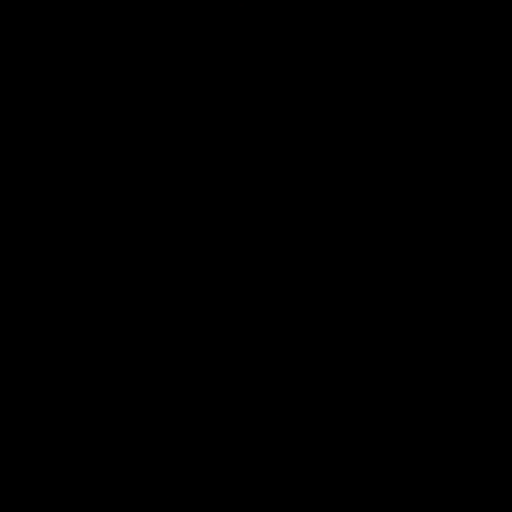

[Series 605: mip collection · coronal · 1.78mm/px · 1 of 32 slices shown]
[im 1/32]
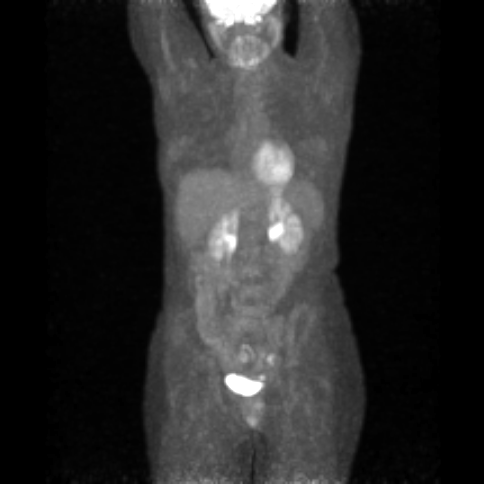

[Series 606: range-ct sk_thigh 5.0 (id)<alpha range> · 5 of 203 slices shown (2 of 2)]
[im 1/203]
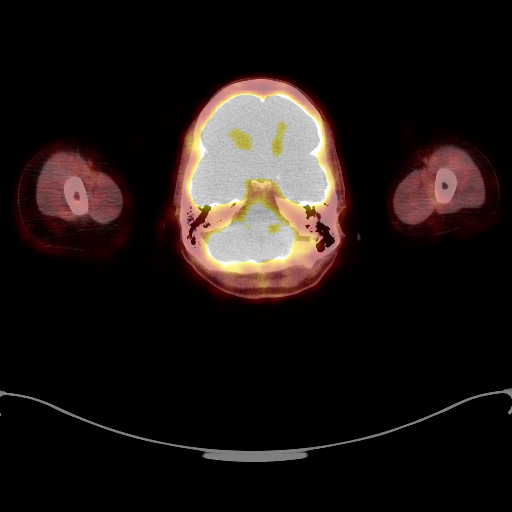
[im 51/203]
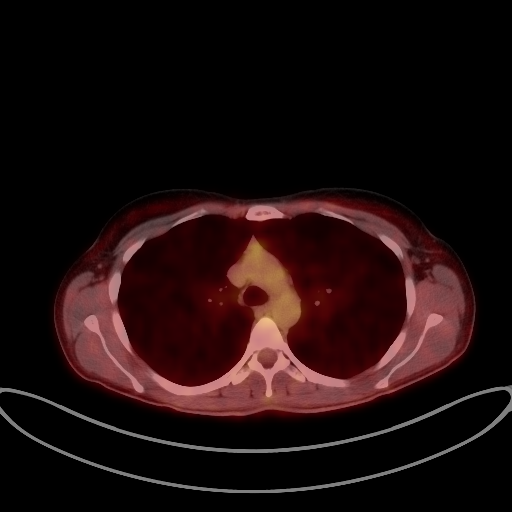
[im 102/203]
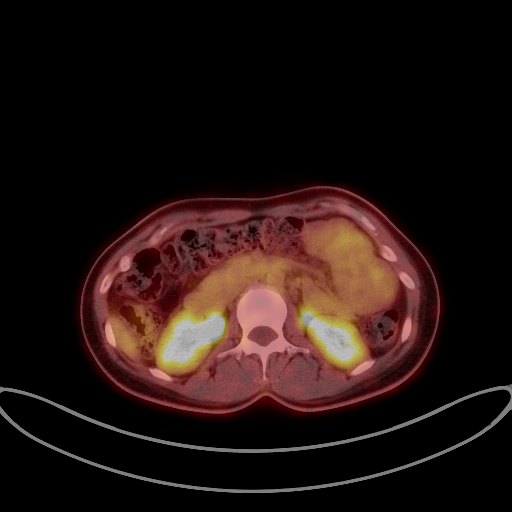
[im 152/203]
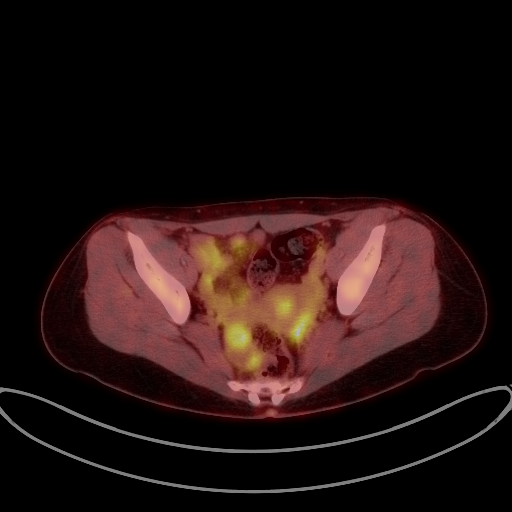
[im 203/203]
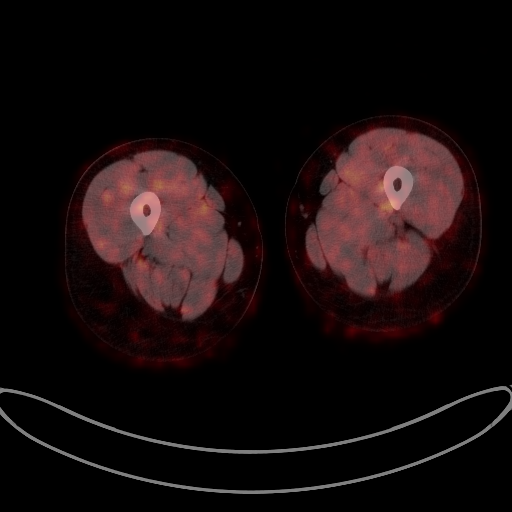

[25 of 25 positions shown; findings below may reference images not displayed]

FINDINGS: NECK

No hypermetabolic lymph nodes in the neck.

CHEST

No hypermetabolic mediastinal or hilar nodes. No suspicious
pulmonary nodules on the CT scan.

ABDOMEN/PELVIS

No abnormal hypermetabolic activity within the liver, pancreas,
adrenal glands, or spleen.

A focus of hypermetabolic activity is seen in the region of the
cervix, which has an SUV max of 8.2. This is consistent with known
primary cervical carcinoma.

No hypermetabolic lymph nodes in the pelvis or abdomen.

Mild metabolic activity in area of both ovaries is most likely
physiologic in etiology. No evidence of hydronephrosis.

SKELETON

No focal hypermetabolic activity to suggest skeletal metastasis.
IMPRESSION: Hypermetabolic focus in cervix, consistent with known primary
cervical carcinoma.

No evidence of pelvic nodal or distant metastatic disease.

## 2018-02-03 ENCOUNTER — Encounter: Payer: Self-pay | Admitting: Gynecologic Oncology

## 2018-02-03 ENCOUNTER — Inpatient Hospital Stay: Payer: BLUE CROSS/BLUE SHIELD | Attending: Gynecologic Oncology | Admitting: Gynecologic Oncology

## 2018-02-03 VITALS — BP 107/66 | HR 65 | Temp 98.7°F | Resp 16 | Ht 64.0 in | Wt 131.2 lb

## 2018-02-03 DIAGNOSIS — Z90722 Acquired absence of ovaries, bilateral: Secondary | ICD-10-CM | POA: Diagnosis not present

## 2018-02-03 DIAGNOSIS — C539 Malignant neoplasm of cervix uteri, unspecified: Secondary | ICD-10-CM | POA: Insufficient documentation

## 2018-02-03 DIAGNOSIS — Z90710 Acquired absence of both cervix and uterus: Secondary | ICD-10-CM | POA: Insufficient documentation

## 2018-02-03 NOTE — Patient Instructions (Signed)
Please notify Dr Denman George at phone number [phone] if you notice vaginal bleeding, new pelvic or abdominal pains, bloating, feeling full easy, or a change in bladder or bowel function.   Please follow-up with Dr Marvel Plan in January, as planned, for pap smear. After that visit contact Dr Serita Grit office at the above number to schedule a visit with her for June/July 2020.

## 2018-02-03 NOTE — Progress Notes (Signed)
Follow-up Note: Gyn-Onc  Consult was requested by Dr. Marvel Plan for the evaluation of Tiffany Faulkner 37 y.o. female  CC:  Chief Complaint  Patient presents with  . Cervical cancer, FIGO stage IB1 [facility])    Assessment/Plan:  [name]  is a 37 y.o.  year old with stage IB1 cervical cancer (adenocarcinoma), s/p radical hysterectomy in January, 7169, complicated by postop urinary retention.   No evidence of disease recurrence on exam.   Urinary retention: resolved.  Cervical cancer: low risk features. No adjuvant therapy recommended according to NCCN guidelines. Recommend continue 6 monthly exams until 5 years postop (2023). Annual vaginal cytology in December, with obligate high risk HPV testing to evaluate for secondary HPV related dysplasia.  She will see Dr Marvel Plan in January, 2020 and myself in June/July 2020.    HPI: Tiffany Faulkner is a 37 year old G1P1 who is seen in consultation at the request of Dr Paula Compton for adenocarcinoma of the cervix.  The patient was considering having a second child and went to see Dr Marvel Plan in November, 2017 for IUD removal. 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   The patient was seen for colposcopy with Dr Marvel Plan on 04/20/16 and a friable lesion was seen on the face of the cervix. It was biopsied at 4 o'cloc, 2 o'clock, 11 and 7 o'clock as well as ECC and endometrial biopsies all of which confirmed adenocarcinoma.  She reports a prior pap smear in 2015 at the time of her prior delivery which was normal. 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  The patient is otherwise very healthy. She has a history of 1 prior preterm vaginal delivery but no abdominal surgeries.  She reports vaginal spotting but it has been present since 2015 when her IUD was placed.  She reports postcoital bleeding for approximately 3 years  (no recent change).  On 05/27/16 she underwent robotic assisted radical hysterectomy, bilateral salpingectomy, bilateral pelvic lymphadenectomy. Pathology revealed a 2cm low grade adenocarcinoma with no LVSI and 7 of 2mm (inner half) cervical stromal invasion. Nodes were negative .She had low risk disease and therefore no adjuvant therapy was recommended in accordance with NCCN guidelines.  1 week postoperatively she returned to the office for foley removal and voiding trial which she failed and was instructed on how to self catheterize.  Interval Hx:  Since that time she has felt well. No bleeding. No symptoms concerning for recurrence. She seems to have resolved the issues of urinary retention. Pap with Dr Nena Alexander office in December, 2018   Current Meds:  Outpatient Encounter Medications as of 02/03/2018  Medication Sig  . cholecalciferol (VITAMIN D) 1000 units tablet Take 1,000 Units by mouth daily.  Marland Kitchen senna-docusate (SENOKOT S) 8.6-50 MG tablet Take 2-3 tablets by mouth at bedtime.   No facility-administered encounter medications on file as of 02/03/2018.     Allergy: No Known Allergies  Social Hx:   Social History   Socioeconomic History  . Marital status: Married    Spouse name: Not on file  . Number of children: Not on file  . Years of education: Not on file  . Highest education level: Not on file  Occupational History  . Not on file  Social Needs  . Financial resource strain: Not on file  . Food insecurity:    Worry: Not on  file    Inability: Not on file  . Transportation needs:    Medical: Not on file    Non-medical: Not on file  Tobacco Use  . Smoking status: Never Smoker  . Smokeless tobacco: Never Used  Substance and Sexual Activity  . Alcohol use: Yes    Comment: occasional   . Drug use: No  . Sexual activity: Not on file  Lifestyle  . Physical activity:    Days per week: Not on file    Minutes per session: Not on file  . Stress: Not on file   Relationships  . Social connections:    Talks on phone: Not on file    Gets together: Not on file    Attends religious service: Not on file    Active member of club or organization: Not on file    Attends meetings of clubs or organizations: Not on file    Relationship status: Not on file  . Intimate partner violence:    Fear of current or ex partner: Not on file    Emotionally abused: Not on file    Physically abused: Not on file    Forced sexual activity: Not on file  Other Topics Concern  . Not on file  Social History Narrative  . Not on file    Past Surgical Hx:  Past Surgical History:  Procedure Laterality Date  . BILATERAL SALPINGECTOMY Bilateral 05/27/2016   Procedure: BILATERAL SALPINGECTOMY;  Surgeon: Everitt Amber, MD;  Location: [facility];  Service: Gynecology;  Laterality: Bilateral;  . LYMPH NODE DISSECTION Bilateral 05/27/2016   Procedure: LYMPH NODE DISSECTION;  Surgeon: Everitt Amber, MD;  Location: [facility];  Service: Gynecology;  Laterality: Bilateral;  . ROBOTIC ASSISTED TOTAL HYSTERECTOMY N/A 05/27/2016   Procedure: XI ROBOTIC ASSISTED RADICAL  HYSTERECTOMY,BILATERAL OVARIAN TRANSPOSITION;  Surgeon: Everitt Amber, MD;  Location: [facility];  Service: Gynecology;  Laterality: N/A;    Past Medical Hx: History reviewed. No pertinent past medical history.  Past Gynecological History:  SVD x 1 (preterm at 28 weeks) Patient's last menstrual period was 05/07/2016.  Family Hx:  Family History  Problem Relation Age of Onset  . Lung cancer Maternal Grandmother   . Diabetes Maternal Grandfather   . Heart disease Maternal Grandfather     Review of Systems:  Constitutional  Feels well,    ENT Normal appearing ears and nares bilaterally Skin/Breast  No rash, sores, jaundice, itching, dryness Cardiovascular  No chest pain, shortness of breath, or edema  Pulmonary  No cough or wheeze.  Gastro Intestinal  No nausea, vomitting, or diarrhoea. No bright red blood per rectum, no  abdominal pain, change in bowel movement, or constipation.  Genito Urinary: no foley Musculo Skeletal  No myalgia, arthralgia, joint swelling or pain  Neurologic  No weakness, numbness, change in gait,  Psychology  No depression, anxiety, insomnia.   Vitals:  Blood pressure 107/66, pulse 65, temperature 98.7 F (37.1 C), temperature source Oral, resp. rate 16, height 5\' 4"  (1.626 m), weight 131 lb 3.2 oz (59.5 kg), last menstrual period 05/07/2016, SpO2 99 %.  Physical Exam: WD in NAD Neck  Supple NROM, without any enlargements.  Lymph Node Survey No cervical supraclavicular or inguinal adenopathy Cardiovascular  Pulse normal rate, regularity and rhythm. S1 and S2 normal.  Lungs  Clear to auscultation bilateraly, without wheezes/crackles/rhonchi. Good air movement.  Skin  No rash/lesions/breakdown  Psychiatry  Alert and oriented to person, place, and time  Abdomen  Normoactive bowel sounds, abdomen  soft, non-tender and thin without evidence of hernia. Incisions well healed.  Back No CVA tenderness Genito Urinary : normal external female genitalia, normal vagina. Vaginal cuff without lesions or masses. Rectal  deferred Extremities  No bilateral cyanosis, clubbing or edema.  Thereasa Solo, MD  02/03/2018, 3:19 PM

## 2018-05-30 ENCOUNTER — Encounter: Payer: Self-pay | Admitting: Gynecologic Oncology

## 2018-11-30 ENCOUNTER — Telehealth: Payer: Self-pay | Admitting: *Deleted

## 2018-11-30 NOTE — Telephone Encounter (Signed)
Patient called and scheduled a follow up appt for August  

## 2018-12-11 ENCOUNTER — Inpatient Hospital Stay: Payer: BC Managed Care – PPO | Attending: Gynecologic Oncology | Admitting: Gynecologic Oncology

## 2018-12-11 ENCOUNTER — Encounter: Payer: Self-pay | Admitting: Gynecologic Oncology

## 2018-12-11 ENCOUNTER — Other Ambulatory Visit: Payer: Self-pay

## 2018-12-11 VITALS — BP 107/72 | HR 74 | Temp 98.6°F | Resp 18 | Ht 64.0 in | Wt 129.7 lb

## 2018-12-11 DIAGNOSIS — C539 Malignant neoplasm of cervix uteri, unspecified: Secondary | ICD-10-CM

## 2018-12-11 DIAGNOSIS — Z90722 Acquired absence of ovaries, bilateral: Secondary | ICD-10-CM | POA: Diagnosis not present

## 2018-12-11 DIAGNOSIS — R87810 Cervical high risk human papillomavirus (HPV) DNA test positive: Secondary | ICD-10-CM

## 2018-12-11 DIAGNOSIS — Z90710 Acquired absence of both cervix and uterus: Secondary | ICD-10-CM

## 2018-12-11 NOTE — Progress Notes (Signed)
Follow-up Note: Gyn-Onc  Consult was requested by Dr. Marvel Plan for the evaluation of Tiffany Faulkner 38 y.o. female  CC:  Chief Complaint  Patient presents with  . Cervical Cancer    follow-up    Assessment/Plan:  [name]  is a 38 y.o.  year old with stage IB1 cervical cancer (adenocarcinoma), s/p radical hysterectomy in January, 9323, complicated by postop urinary retention.   No evidence of disease recurrence on exam.   Urinary retention: resolved.  Cervical cancer: low risk features. No adjuvant therapy recommended according to NCCN guidelines. Recommend continue 6 monthly exams until 5 years postop (2023). Annual vaginal cytology in December, with obligate high risk HPV testing to evaluate for secondary HPV related dysplasia.  She will see Dr Marvel Plan in January, 2021 for pap and HPV and myself in August 2021.    HPI: Tiffany Faulkner is a 38 year old G1P1 who is seen in consultation at the request of Dr Paula Compton for adenocarcinoma of the cervix.  The patient was considering having a second child and went to see Dr Marvel Plan in November, 2017 for IUD removal. 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   The patient was seen for colposcopy with Dr Marvel Plan on 04/20/16 and a friable lesion was seen on the face of the cervix. It was biopsied at 4 o'cloc, 2 o'clock, 11 and 7 o'clock as well as ECC and endometrial biopsies all of which confirmed adenocarcinoma.  She reports a prior pap smear in 2015 at the time of her prior delivery which was normal. 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  The patient is otherwise very healthy. She has a history of 1 prior preterm vaginal delivery but no abdominal surgeries.  She reports vaginal spotting but it has been present since 2015 when her IUD was placed.  She reports postcoital bleeding for approximately 3  years (no recent change).  On 05/27/16 she underwent robotic assisted radical hysterectomy, bilateral salpingectomy, bilateral pelvic lymphadenectomy. Pathology revealed a 2cm low grade adenocarcinoma with no LVSI and 7 of 79mm (inner half) cervical stromal invasion. Nodes were negative .She had low risk disease and therefore no adjuvant therapy was recommended in accordance with NCCN guidelines.  1 week postoperatively she returned to the office for foley removal and voiding trial which she failed and was instructed on how to self catheterize.  Interval Hx:  Since that time she has felt well. No bleeding. No symptoms concerning for recurrence. She seems to have resolved the issues of urinary retention. Pap with Dr Tiffany Faulkner office in January, 2020 showed benign cytology and negative high risk HPV.    Current Meds:  Outpatient Encounter Medications as of 12/11/2018  Medication Sig  . cholecalciferol (VITAMIN D) 1000 units tablet Take 1,000 Units by mouth daily.  Marland Kitchen senna-docusate (SENOKOT S) 8.6-50 MG tablet Take 2-3 tablets by mouth at bedtime.   No facility-administered encounter medications on file as of 12/11/2018.     Allergy: No Known Allergies  Social Hx:   Social History   Socioeconomic History  . Marital status: Married    Spouse name: Not on file  . Number of children: Not on file  . Years of education: Not on file  . Highest education level: Not on file  Occupational History  . Not on file  Social Needs  . Financial resource strain:  Not on file  . Food insecurity    Worry: Not on file    Inability: Not on file  . Transportation needs    Medical: Not on file    Non-medical: Not on file  Tobacco Use  . Smoking status: Never Smoker  . Smokeless tobacco: Never Used  Substance and Sexual Activity  . Alcohol use: Yes    Comment: occasional   . Drug use: No  . Sexual activity: Not on file  Lifestyle  . Physical activity    Days per week: Not on file    Minutes  per session: Not on file  . Stress: Not on file  Relationships  . Social Herbalist on phone: Not on file    Gets together: Not on file    Attends religious service: Not on file    Active member of club or organization: Not on file    Attends meetings of clubs or organizations: Not on file    Relationship status: Not on file  . Intimate partner violence    Fear of current or ex partner: Not on file    Emotionally abused: Not on file    Physically abused: Not on file    Forced sexual activity: Not on file  Other Topics Concern  . Not on file  Social History Narrative  . Not on file    Past Surgical Hx:  Past Surgical History:  Procedure Laterality Date  . BILATERAL SALPINGECTOMY Bilateral 05/27/2016   Procedure: BILATERAL SALPINGECTOMY;  Surgeon: Everitt Amber, MD;  Location: [facility];  Service: Gynecology;  Laterality: Bilateral;  . LYMPH NODE DISSECTION Bilateral 05/27/2016   Procedure: LYMPH NODE DISSECTION;  Surgeon: Everitt Amber, MD;  Location: [facility];  Service: Gynecology;  Laterality: Bilateral;  . ROBOTIC ASSISTED TOTAL HYSTERECTOMY N/A 05/27/2016   Procedure: XI ROBOTIC ASSISTED RADICAL  HYSTERECTOMY,BILATERAL OVARIAN TRANSPOSITION;  Surgeon: Everitt Amber, MD;  Location: [facility];  Service: Gynecology;  Laterality: N/A;    Past Medical Hx: History reviewed. No pertinent past medical history.  Past Gynecological History:  SVD x 1 (preterm at 28 weeks) Patient's last menstrual period was 05/07/2016.  Family Hx:  Family History  Problem Relation Age of Onset  . Lung cancer Maternal Grandmother   . Diabetes Maternal Grandfather   . Heart disease Maternal Grandfather     Review of Systems:  Constitutional  Feels well,    ENT Normal appearing ears and nares bilaterally Skin/Breast  No rash, sores, jaundice, itching, dryness Cardiovascular  No chest pain, shortness of breath, or edema  Pulmonary  No cough or wheeze.  Gastro Intestinal  No nausea, vomitting, or  diarrhoea. No bright red blood per rectum, no abdominal pain, change in bowel movement, or constipation.  Genito Urinary: no foley Musculo Skeletal  No myalgia, arthralgia, joint swelling or pain  Neurologic  No weakness, numbness, change in gait,  Psychology  No depression, anxiety, insomnia.   Vitals:  Blood pressure 107/72, pulse 74, temperature 98.6 F (37 C), temperature source Oral, resp. rate 18, height 5\' 4"  (1.626 m), weight 129 lb 11.2 oz (58.8 kg), last menstrual period 05/07/2016, SpO2 99 %.  Physical Exam: WD in NAD Neck  Supple NROM, without any enlargements.  Lymph Node Survey No cervical supraclavicular or inguinal adenopathy Cardiovascular  Pulse normal rate, regularity and rhythm. S1 and S2 normal.  Lungs  Clear to auscultation bilateraly, without wheezes/crackles/rhonchi. Good air movement.  Skin  No rash/lesions/breakdown  Psychiatry  Alert and  oriented to person, place, and time  Abdomen  Normoactive bowel sounds, abdomen soft, non-tender and thin without evidence of hernia. Incisions well healed.  Back No CVA tenderness Genito Urinary : normal external female genitalia, normal vagina. Vaginal cuff without lesions or masses. Rectal  deferred Extremities  No bilateral cyanosis, clubbing or edema.  Thereasa Solo, MD  12/11/2018, 2:46 PM

## 2018-12-11 NOTE — Patient Instructions (Signed)
Please notify Dr Denman George at phone number [phone] if you notice vaginal bleeding, new pelvic or abdominal pains, bloating, feeling full easy, or a change in bladder or bowel function.   Please contact Dr Serita Grit office (at [phone]) in April, 2021 to request an appointment with her for August, 2021.  Dr Denman George recommends trying magnesium supplements and metamucil to assist with constipation and evacuation of bowel movements.

## 2019-07-19 ENCOUNTER — Ambulatory Visit: Payer: 59 | Attending: Internal Medicine

## 2019-07-19 DIAGNOSIS — Z23 Encounter for immunization: Secondary | ICD-10-CM

## 2019-07-19 NOTE — Progress Notes (Signed)
   Covid-19 Vaccination Clinic  Name:  Tiffany Faulkner    MRN: [id number] DOB: 1981-04-17  07/19/2019  [name] was observed post Covid-19 immunization for 15 minutes without incident. She was provided with Vaccine Information Sheet and instruction to access the V-Safe system.   [name] was instructed to call 911 with any severe reactions post vaccine: Marland Kitchen Difficulty breathing  . Swelling of face and throat  . A fast heartbeat  . A bad rash all over body  . Dizziness and weakness   Immunizations Administered    Name Date Dose VIS Date Route   Pfizer COVID-19 Vaccine 07/19/2019  9:38 AM 0.3 mL 04/20/2019 Intramuscular   Manufacturer: [place]   Lot: [id number]   [place]: [id number]

## 2019-08-13 ENCOUNTER — Ambulatory Visit: Payer: 59 | Attending: Internal Medicine

## 2019-08-13 DIAGNOSIS — Z23 Encounter for immunization: Secondary | ICD-10-CM

## 2019-08-13 NOTE — Progress Notes (Signed)
   Covid-19 Vaccination Clinic  Name:  Tiffany Faulkner    MRN: [id number] DOB: [birth year]  08/13/2019  Tiffany Faulkner was observed post Covid-19 immunization for 15 minutes without incident. She was provided with Vaccine Information Sheet and instruction to access the V-Safe system.   Tiffany Faulkner was instructed to call 911 with any severe reactions post vaccine: Marland Kitchen Difficulty breathing  . Swelling of face and throat  . A fast heartbeat  . A bad rash all over body  . Dizziness and weakness   Immunizations Administered    Name Date Dose VIS Date Route   Pfizer COVID-19 Vaccine 08/13/2019  9:55 AM 0.3 mL 04/20/2019 Intramuscular   Manufacturer: [place]   Lot: [id number]   [place]: [id number]

## 2019-12-20 ENCOUNTER — Encounter: Payer: Self-pay | Admitting: Gynecologic Oncology

## 2019-12-20 ENCOUNTER — Inpatient Hospital Stay: Payer: 59 | Attending: Gynecologic Oncology | Admitting: Gynecologic Oncology

## 2019-12-20 ENCOUNTER — Other Ambulatory Visit: Payer: Self-pay

## 2019-12-20 VITALS — BP 114/74 | HR 74 | Temp 98.7°F | Resp 16 | Ht 64.0 in | Wt 128.0 lb

## 2019-12-20 DIAGNOSIS — Z9079 Acquired absence of other genital organ(s): Secondary | ICD-10-CM | POA: Diagnosis not present

## 2019-12-20 DIAGNOSIS — C539 Malignant neoplasm of cervix uteri, unspecified: Secondary | ICD-10-CM

## 2019-12-20 DIAGNOSIS — Z8541 Personal history of malignant neoplasm of cervix uteri: Secondary | ICD-10-CM | POA: Diagnosis present

## 2019-12-20 DIAGNOSIS — Z90710 Acquired absence of both cervix and uterus: Secondary | ICD-10-CM | POA: Insufficient documentation

## 2019-12-20 DIAGNOSIS — Z08 Encounter for follow-up examination after completed treatment for malignant neoplasm: Secondary | ICD-10-CM | POA: Insufficient documentation

## 2019-12-20 NOTE — Patient Instructions (Signed)
Please notify Dr Denman George at phone number [phone] if you notice vaginal bleeding, new pelvic or abdominal pains, bloating, feeling full easy, or a change in bladder or bowel function.   Please contact Dr Serita Grit office (at [phone]) in April, 2022 to request an appointment with her for August, 2022. Please see Dr Marvel Plan in March, 2022.

## 2019-12-20 NOTE — Progress Notes (Signed)
Follow-up Note: Gyn-Onc  Consult was requested by Tiffany. Marvel Faulkner for the evaluation of Tiffany Faulkner 39 y.o. female  CC:  Chief Complaint  Patient presents with  . Cervical cancer, FIGO stage IB1 ([facility]CC)    follow up    Assessment/Faulkner:  [name]  is a 39 y.o.  year old with stage IB1 cervical cancer (adenocarcinoma), s/p radical hysterectomy in January, 6734, complicated by postop urinary retention.   No evidence of disease recurrence on exam.   Cervical cancer: low risk features. No adjuvant therapy recommended according to NCCN guidelines. Recommend continue 6 monthly exams until 5 years postop (2023). Annual vaginal cytology in December, with obligate high risk HPV testing to evaluate for secondary HPV related dysplasia.  She will see Tiffany Tiffany Faulkner in March, 2022 for pap and HPV and myself in August 2022.    HPI: Tiffany Faulkner is a 39 year old G1P1 who is seen in consultation at the request of Tiffany Faulkner for adenocarcinoma of the cervix.  The patient was considering having a second child and went to see Tiffany Tiffany Faulkner in November, 2017 for IUD removal. 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   The patient was seen for colposcopy with Tiffany Tiffany Faulkner on 04/20/16 and a friable lesion was seen on the face of the cervix. It was biopsied at 4 o'cloc, 2 o'clock, 11 and 7 o'clock as well as ECC and endometrial biopsies all of which confirmed adenocarcinoma.  She reports a prior pap smear in 2015 at the time of her prior delivery which was normal. 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  The patient is otherwise very healthy. She has a history of 1 prior preterm vaginal delivery but no abdominal surgeries.  She reports vaginal spotting but it has been present since 2015 when her IUD was placed.  She reports postcoital bleeding for approximately 3 years (no  recent change).  On 05/27/16 she underwent robotic assisted radical hysterectomy, bilateral salpingectomy, bilateral pelvic lymphadenectomy. Pathology revealed a 2cm low grade adenocarcinoma with no LVSI and 7 of 80mm (inner half) cervical stromal invasion. Nodes were negative .She had low risk disease and therefore no adjuvant therapy was recommended in accordance with NCCN guidelines.  1 week postoperatively she returned to the office for foley removal and voiding trial which she failed and was instructed on how to self catheterize.  Interval Hx:  Since that time she has felt well. No bleeding. No symptoms concerning for recurrence. She seems to have resolved the issues of urinary retention. Pap with Tiffany Tiffany Faulkner office in March, 2021 showed benign cytology and negative high risk HPV.    Current Meds:  Outpatient Encounter Medications as of 12/20/2019  Medication Sig  . cholecalciferol (VITAMIN D) 1000 units tablet Take 1,000 Units by mouth daily.  Marland Kitchen senna-docusate (SENOKOT S) 8.6-50 MG tablet Take 2-3 tablets by mouth at bedtime. (Patient not taking: Reported on 12/20/2019)   No facility-administered encounter medications on file as of 12/20/2019.    Allergy: No Known Allergies  Social Hx:   Social History   Socioeconomic History  . Marital status: Married    Spouse name: Not on file  . Number of children: Not on file  . Years of education: Not on file  . Highest education level: Not on file  Occupational History  . Not on file  Tobacco  Use  . Smoking status: Never Smoker  . Smokeless tobacco: Never Used  Vaping Use  . Vaping Use: Never used  Substance and Sexual Activity  . Alcohol use: Yes    Comment: occasional   . Drug use: No  . Sexual activity: Not on file  Other Topics Concern  . Not on file  Social History Narrative  . Not on file   Social Determinants of Health   Financial Resource Strain:   . Difficulty of Paying Living Expenses:   Food Insecurity:    . Worried About Charity fundraiser in the Last Year:   . Arboriculturist in the Last Year:   Transportation Needs:   . Film/video editor (Medical):   Marland Kitchen Lack of Transportation (Non-Medical):   Physical Activity:   . Days of Exercise per Week:   . Minutes of Exercise per Session:   Stress:   . Feeling of Stress :   Social Connections:   . Frequency of Communication with Friends and Family:   . Frequency of Social Gatherings with Friends and Family:   . Attends Religious Services:   . Active Member of Clubs or Organizations:   . Attends Archivist Meetings:   Marland Kitchen Marital Status:   Intimate Partner Violence:   . Fear of Current or Ex-Partner:   . Emotionally Abused:   Marland Kitchen Physically Abused:   . Sexually Abused:     Past Surgical Hx:  Past Surgical History:  Procedure Laterality Date  . BILATERAL SALPINGECTOMY Bilateral 05/27/2016   Procedure: BILATERAL SALPINGECTOMY;  Surgeon: Everitt Amber, MD;  Location: [facility];  Service: Gynecology;  Laterality: Bilateral;  . LYMPH NODE DISSECTION Bilateral 05/27/2016   Procedure: LYMPH NODE DISSECTION;  Surgeon: Everitt Amber, MD;  Location: [facility];  Service: Gynecology;  Laterality: Bilateral;  . ROBOTIC ASSISTED TOTAL HYSTERECTOMY N/A 05/27/2016   Procedure: XI ROBOTIC ASSISTED RADICAL  HYSTERECTOMY,BILATERAL OVARIAN TRANSPOSITION;  Surgeon: Everitt Amber, MD;  Location: [facility];  Service: Gynecology;  Laterality: N/A;    Past Medical Hx: History reviewed. No pertinent past medical history.  Past Gynecological History:  SVD x 1 (preterm at 28 weeks) Patient's last menstrual period was 05/07/2016.  Family Hx:  Family History  Problem Relation Age of Onset  . Lung cancer Maternal Grandmother   . Diabetes Maternal Grandfather   . Heart disease Maternal Grandfather     Review of Systems:  Constitutional  Feels well,    ENT Normal appearing ears and nares bilaterally Skin/Breast  No rash, sores, jaundice, itching,  dryness Cardiovascular  No chest pain, shortness of breath, or edema  Pulmonary  No cough or wheeze.  Gastro Intestinal  No nausea, vomitting, or diarrhoea. No bright red blood per rectum, no abdominal pain, change in bowel movement, or constipation.  Genito Urinary: no foley Musculo Skeletal  No myalgia, arthralgia, joint swelling or pain  Neurologic  No weakness, numbness, change in gait,  Psychology  No depression, anxiety, insomnia.   Vitals:  Blood pressure 114/74, pulse 74, temperature 98.7 F (37.1 C), temperature source Oral, resp. rate 16, height 5\' 4"  (1.626 m), weight 128 lb (58.1 kg), last menstrual period 05/07/2016, SpO2 100 %.  Physical Exam: WD in NAD Neck  Supple NROM, without any enlargements.  Lymph Node Survey No cervical supraclavicular or inguinal adenopathy Cardiovascular  Pulse normal rate, regularity and rhythm. S1 and S2 normal.  Lungs  Clear to auscultation bilateraly, without wheezes/crackles/rhonchi. Good air movement.  Skin  No rash/lesions/breakdown  Psychiatry  Alert and oriented to person, place, and time  Abdomen  Normoactive bowel sounds, abdomen soft, non-tender and thin without evidence of hernia. Incisions well healed.  Back No CVA tenderness Genito Urinary : normal external female genitalia, normal vagina. Vaginal cuff without lesions or masses. Rectal  deferred Extremities  No bilateral cyanosis, clubbing or edema.  Thereasa Solo, MD  12/20/2019, 1:58 PM

## 2021-01-14 ENCOUNTER — Telehealth: Payer: Self-pay | Admitting: *Deleted

## 2021-01-14 NOTE — Telephone Encounter (Signed)
Patient called and scheduled a follow up appt  

## 2021-01-20 NOTE — Progress Notes (Signed)
Follow-up Note: Gyn-Onc  Consult was requested by Dr. Marvel Plan for the evaluation of Tiffany Faulkner 40 y.o. female  CC:  Chief Complaint  Patient presents with   Cervical cancer, FIGO stage IB1 [facility])     Assessment/Plan:  [name]  is a 40 y.o.  year old with stage IB1 cervical cancer (adenocarcinoma), s/p radical hysterectomy in January, 99991111, complicated by postop urinary retention.   No evidence of disease recurrence on exam.   Cervical cancer: low risk features. No adjuvant therapy recommended according to NCCN guidelines. Recommend   continue 6 monthly exams until 5 years postop (2023). Annual vaginal cytology in March, with obligate high risk HPV testing to evaluate for secondary HPV related dysplasia.  She will see Dr Marvel Plan in March, 2023 for pap and HPV and then annually. She will return to our practice on a prn basis.   HPI: Tiffany Faulkner is a 40 year old G1P1 who was seen in consultation at the request of Dr Paula Compton for adenocarcinoma of the cervix.  The patient was considering having a second child and went to see Dr Marvel Plan in November, 2017 for IUD removal. 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 She had symptoms of intermenstrual and post-coital spotting.   The patient was seen for colposcopy with Dr Marvel Plan on 04/20/16 and a friable lesion was seen on the face of the cervix. It was biopsied at 4 o'clock, 2 o'clock, 11 and 7 o'clock as well as ECC and endometrial biopsies all of which confirmed adenocarcinoma.  She had reported a preceding pap smear in 2015 at the time of her prior delivery which was normal. 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  On 05/27/16 she underwent robotic assisted radical hysterectomy, bilateral salpingectomy, bilateral pelvic lymphadenectomy. Pathology revealed a 2cm low grade adenocarcinoma with no  LVSI and 7 of 49m (inner half) cervical stromal invasion. Nodes were negative .She had low risk disease and therefore no adjuvant therapy was recommended in accordance with NCCN guidelines.  Interval Hx:  Since that time she has felt well. No bleeding. No symptoms concerning for recurrence. She seems to have resolved some initial issues with urinary retention. Pap with Dr RNena Alexanderoffice in March, 2021 showed benign cytology and negative high risk HPV.    Current Meds:  Outpatient Encounter Medications as of 01/21/2021  Medication Sig   cholecalciferol (VITAMIN D) 1000 units tablet Take 1,000 Units by mouth daily.   senna-docusate (SENOKOT-S) 8.6-50 MG tablet Take 2-3 tablets by mouth at bedtime. (Patient not taking: No sig reported)   No facility-administered encounter medications on file as of 01/21/2021.    Allergy: No Known Allergies  Social Hx:   Social History   Socioeconomic History   Marital status: Married    Spouse name: Not on file   Number of children: Not on file   Years of education: Not on file   Highest education level: Not on file  Occupational History   Not on file  Tobacco Use   Smoking status: Never   Smokeless tobacco: Never  Vaping Use   Vaping Use: Never used  Substance and Sexual Activity   Alcohol use: Yes    Comment: occasional    Drug use: No   Sexual activity: Not on file  Other Topics Concern   Not on file  Social History Narrative  Not on file   Social Determinants of Health   Financial Resource Strain: Not on file  Food Insecurity: Not on file  Transportation Needs: Not on file  Physical Activity: Not on file  Stress: Not on file  Social Connections: Not on file  Intimate Partner Violence: Not on file    Past Surgical Hx:  Past Surgical History:  Procedure Laterality Date   BILATERAL SALPINGECTOMY Bilateral 05/27/2016   Procedure: BILATERAL SALPINGECTOMY;  Surgeon: Everitt Amber, MD;  Location: [facility];  Service: Gynecology;   Laterality: Bilateral;   LYMPH NODE DISSECTION Bilateral 05/27/2016   Procedure: LYMPH NODE DISSECTION;  Surgeon: Everitt Amber, MD;  Location: [facility];  Service: Gynecology;  Laterality: Bilateral;   ROBOTIC ASSISTED TOTAL HYSTERECTOMY N/A 05/27/2016   Procedure: XI ROBOTIC ASSISTED RADICAL  HYSTERECTOMY,BILATERAL OVARIAN TRANSPOSITION;  Surgeon: Everitt Amber, MD;  Location: [facility];  Service: Gynecology;  Laterality: N/A;    Past Medical Hx: History reviewed. No pertinent past medical history.  Past Gynecological History:  SVD x 1 (preterm at 28 weeks) Patient's last menstrual period was 05/07/2016.  Family Hx:  Family History  Problem Relation Age of Onset   Lung cancer Maternal Grandmother    Diabetes Maternal Grandfather    Heart disease Maternal Grandfather     Review of Systems:  Constitutional  Feels well,    ENT Normal appearing ears and nares bilaterally Skin/Breast  No rash, sores, jaundice, itching, dryness Cardiovascular  No chest pain, shortness of breath, or edema  Pulmonary  No cough or wheeze.  Gastro Intestinal  No nausea, vomitting, or diarrhoea. No bright red blood per rectum, no abdominal pain, change in bowel movement, or constipation.  Genito Urinary: no foley Musculo Skeletal  No myalgia, arthralgia, joint swelling or pain  Neurologic  No weakness, numbness, change in gait,  Psychology  No depression, anxiety, insomnia.   Vitals:  Blood pressure 111/75, pulse 76, temperature 98.1 F (36.7 C), temperature source Oral, resp. rate 16, height '5\' 4"'$  (1.626 m), weight 132 lb (59.9 kg), last menstrual period 05/07/2016, SpO2 100 %.  Physical Exam: WD in NAD Neck  Supple NROM, without any enlargements.  Lymph Node Survey No cervical supraclavicular or inguinal adenopathy Cardiovascular  Pulse normal rate, regularity and rhythm. S1 and S2 normal.  Lungs  Clear to auscultation bilateraly, without wheezes/crackles/rhonchi. Good air movement.  Skin  No  rash/lesions/breakdown  Psychiatry  Alert and oriented to person, place, and time  Abdomen  Normoactive bowel sounds, abdomen soft, non-tender and thin without evidence of hernia. Incisions well healed.  Back No CVA tenderness Genito Urinary : normal external female genitalia, normal vagina. Vaginal cuff without lesions or masses. Rectal  deferred Extremities  No bilateral cyanosis, clubbing or edema.  Thereasa Solo, MD  01/21/2021, 10:15 AM

## 2021-01-21 ENCOUNTER — Encounter: Payer: Self-pay | Admitting: Gynecologic Oncology

## 2021-01-21 ENCOUNTER — Inpatient Hospital Stay: Payer: 59 | Attending: Gynecologic Oncology | Admitting: Gynecologic Oncology

## 2021-01-21 ENCOUNTER — Other Ambulatory Visit: Payer: Self-pay

## 2021-01-21 VITALS — BP 111/75 | HR 76 | Temp 98.1°F | Resp 16 | Ht 64.0 in | Wt 132.0 lb

## 2021-01-21 DIAGNOSIS — Z9079 Acquired absence of other genital organ(s): Secondary | ICD-10-CM | POA: Insufficient documentation

## 2021-01-21 DIAGNOSIS — Z90710 Acquired absence of both cervix and uterus: Secondary | ICD-10-CM | POA: Insufficient documentation

## 2021-01-21 DIAGNOSIS — Z8541 Personal history of malignant neoplasm of cervix uteri: Secondary | ICD-10-CM | POA: Diagnosis not present

## 2021-01-21 DIAGNOSIS — C539 Malignant neoplasm of cervix uteri, unspecified: Secondary | ICD-10-CM

## 2021-01-21 NOTE — Patient Instructions (Signed)
Please notify Dr Denman George at phone number [phone] if you notice vaginal bleeding, new pelvic or abdominal pains, bloating, feeling full easy, or a change in bladder or bowel function.   Please follow-up with Dr Marvel Plan once a year for a pap and HPV test for the next 15 years.  Congratulations on nearly 5 years being cancer free! This means you are graduating from [facility] follow-up, but please return on an as needed basis.

## 2021-12-10 DIAGNOSIS — Z1389 Encounter for screening for other disorder: Secondary | ICD-10-CM | POA: Diagnosis not present

## 2021-12-10 DIAGNOSIS — Z1231 Encounter for screening mammogram for malignant neoplasm of breast: Secondary | ICD-10-CM | POA: Diagnosis not present

## 2021-12-10 DIAGNOSIS — Z01419 Encounter for gynecological examination (general) (routine) without abnormal findings: Secondary | ICD-10-CM | POA: Diagnosis not present

## 2021-12-10 DIAGNOSIS — C539 Malignant neoplasm of cervix uteri, unspecified: Secondary | ICD-10-CM | POA: Diagnosis not present

## 2021-12-10 DIAGNOSIS — E559 Vitamin D deficiency, unspecified: Secondary | ICD-10-CM | POA: Diagnosis not present

## 2021-12-10 DIAGNOSIS — R69 Illness, unspecified: Secondary | ICD-10-CM | POA: Diagnosis not present

## 2021-12-10 DIAGNOSIS — Z13220 Encounter for screening for lipoid disorders: Secondary | ICD-10-CM | POA: Diagnosis not present

## 2021-12-10 DIAGNOSIS — Z130 Encounter for screening for diseases of the blood and blood-forming organs and certain disorders involving the immune mechanism: Secondary | ICD-10-CM | POA: Diagnosis not present

## 2021-12-10 DIAGNOSIS — Z13228 Encounter for screening for other metabolic disorders: Secondary | ICD-10-CM | POA: Diagnosis not present

## 2021-12-10 DIAGNOSIS — Z124 Encounter for screening for malignant neoplasm of cervix: Secondary | ICD-10-CM | POA: Diagnosis not present

## 2022-12-22 DIAGNOSIS — Z1321 Encounter for screening for nutritional disorder: Secondary | ICD-10-CM | POA: Diagnosis not present

## 2022-12-22 DIAGNOSIS — Z130 Encounter for screening for diseases of the blood and blood-forming organs and certain disorders involving the immune mechanism: Secondary | ICD-10-CM | POA: Diagnosis not present

## 2022-12-22 DIAGNOSIS — Z1389 Encounter for screening for other disorder: Secondary | ICD-10-CM | POA: Diagnosis not present

## 2022-12-22 DIAGNOSIS — Z13220 Encounter for screening for lipoid disorders: Secondary | ICD-10-CM | POA: Diagnosis not present

## 2022-12-22 DIAGNOSIS — C539 Malignant neoplasm of cervix uteri, unspecified: Secondary | ICD-10-CM | POA: Diagnosis not present

## 2022-12-22 DIAGNOSIS — Z01419 Encounter for gynecological examination (general) (routine) without abnormal findings: Secondary | ICD-10-CM | POA: Diagnosis not present

## 2022-12-22 DIAGNOSIS — E559 Vitamin D deficiency, unspecified: Secondary | ICD-10-CM | POA: Diagnosis not present

## 2022-12-22 DIAGNOSIS — Z1231 Encounter for screening mammogram for malignant neoplasm of breast: Secondary | ICD-10-CM | POA: Diagnosis not present

## 2022-12-22 DIAGNOSIS — Z13228 Encounter for screening for other metabolic disorders: Secondary | ICD-10-CM | POA: Diagnosis not present

## 2023-01-06 DIAGNOSIS — D225 Melanocytic nevi of trunk: Secondary | ICD-10-CM | POA: Diagnosis not present

## 2023-12-27 DIAGNOSIS — Z1389 Encounter for screening for other disorder: Secondary | ICD-10-CM | POA: Diagnosis not present

## 2023-12-27 DIAGNOSIS — C539 Malignant neoplasm of cervix uteri, unspecified: Secondary | ICD-10-CM | POA: Diagnosis not present

## 2023-12-27 DIAGNOSIS — Z1231 Encounter for screening mammogram for malignant neoplasm of breast: Secondary | ICD-10-CM | POA: Diagnosis not present

## 2023-12-27 DIAGNOSIS — Z01419 Encounter for gynecological examination (general) (routine) without abnormal findings: Secondary | ICD-10-CM | POA: Diagnosis not present

## 2023-12-27 DIAGNOSIS — Z130 Encounter for screening for diseases of the blood and blood-forming organs and certain disorders involving the immune mechanism: Secondary | ICD-10-CM | POA: Diagnosis not present

## 2023-12-27 DIAGNOSIS — Z1151 Encounter for screening for human papillomavirus (HPV): Secondary | ICD-10-CM | POA: Diagnosis not present

## 2023-12-27 DIAGNOSIS — Z124 Encounter for screening for malignant neoplasm of cervix: Secondary | ICD-10-CM | POA: Diagnosis not present

## 2024-01-04 DIAGNOSIS — D2272 Melanocytic nevi of left lower limb, including hip: Secondary | ICD-10-CM | POA: Diagnosis not present
